# Patient Record
Sex: Female | Born: 1970
Health system: Southern US, Community
[De-identification: ages and names within clinical notes are randomized; demographics above are authoritative.]

## PROBLEM LIST (undated history)

## (undated) DIAGNOSIS — I10 Essential (primary) hypertension: Secondary | ICD-10-CM

## (undated) HISTORY — PX: CARPAL TUNNEL RELEASE: SHX101

## (undated) HISTORY — DX: Essential (primary) hypertension: I10

---

## 2015-09-20 ENCOUNTER — Ambulatory Visit: Payer: BLUE CROSS/BLUE SHIELD

## 2016-06-02 ENCOUNTER — Ambulatory Visit (INDEPENDENT_AMBULATORY_CARE_PROVIDER_SITE_OTHER): Payer: BLUE CROSS/BLUE SHIELD | Admitting: Emergency Medicine

## 2016-06-02 ENCOUNTER — Encounter: Payer: Self-pay | Admitting: Emergency Medicine

## 2016-06-02 VITALS — BP 193/102 | HR 77 | Temp 99.0°F | Resp 16 | Ht 60.5 in | Wt 107.6 lb

## 2016-06-02 DIAGNOSIS — Z0001 Encounter for general adult medical examination with abnormal findings: Secondary | ICD-10-CM

## 2016-06-02 DIAGNOSIS — R011 Cardiac murmur, unspecified: Secondary | ICD-10-CM | POA: Diagnosis not present

## 2016-06-02 DIAGNOSIS — R030 Elevated blood-pressure reading, without diagnosis of hypertension: Secondary | ICD-10-CM | POA: Diagnosis not present

## 2016-06-02 DIAGNOSIS — I10 Essential (primary) hypertension: Secondary | ICD-10-CM | POA: Insufficient documentation

## 2016-06-02 MED ORDER — LISINOPRIL-HYDROCHLOROTHIAZIDE 10-12.5 MG PO TABS: 1.0000 | ORAL_TABLET | Freq: Every day | ORAL | 3 refills | Status: DC

## 2016-06-02 NOTE — Patient Instructions (Addendum)
IF you received an x-ray today, you will receive an invoice from [facility]. Please contact [facility] at [phone] with questions or concerns regarding your invoice.   IF you received labwork today, you will receive an invoice from [place]. Please contact [facility] at [phone] with questions or concerns regarding your invoice.   Our billing staff will not be able to assist you with questions regarding bills from these companies.  You will be contacted with the lab results as soon as they are available. The fastest way to get your results is to activate your My Chart account. Instructions are located on the last page of this paperwork. If you have not heard from [place] regarding the results in 2 weeks, please contact this office.      Health Maintenance, Female Adopting a healthy lifestyle and getting preventive care can go a long way to promote health and wellness. Talk with your health care provider about what schedule of regular examinations is right for you. This is a good chance for you to check in with your provider about disease prevention and staying healthy. In between checkups, there are plenty of things you can do on your own. Experts have done a lot of research about which lifestyle changes and preventive measures are most likely to keep you healthy. Ask your health care provider for more information. Weight and diet Eat a healthy diet  Be sure to include plenty of vegetables, fruits, low-fat dairy products, and lean protein.  Do not eat a lot of foods high in solid fats, added sugars, or salt.  Get regular exercise. This is one of the most important things you can do for your health.  Most adults should exercise for at least 150 minutes each week. The exercise should increase your heart rate and make you sweat (moderate-intensity exercise).  Most adults should also do strengthening exercises at least twice a week. This is in addition to the  moderate-intensity exercise. Maintain a healthy weight  Body mass index (BMI) is a measurement that can be used to identify possible weight problems. It estimates body fat based on height and weight. Your health care provider can help determine your BMI and help you achieve or maintain a healthy weight.  For females 64 years of age and older:  A BMI below 18.5 is considered underweight.  A BMI of 18.5 to 24.9 is normal.  A BMI of 25 to 29.9 is considered overweight.  A BMI of 30 and above is considered obese. Watch levels of cholesterol and blood lipids  You should start having your blood tested for lipids and cholesterol at 47 years of age, then have this test every 5 years.  You may need to have your cholesterol levels checked more often if:  Your lipid or cholesterol levels are high.  You are older than 47 years of age.  You are at high risk for heart disease. Cancer screening Lung Cancer  Lung cancer screening is recommended for adults 56-24 years old who are at high risk for lung cancer because of a history of smoking.  A yearly low-dose CT scan of the lungs is recommended for people who:  Currently smoke.  Have quit within the past 15 years.  Have at least a 30-pack-year history of smoking. A pack year is smoking an average of one pack of cigarettes a day for 1 year.  Yearly screening should continue until it has been 15 years since you quit.  Yearly screening should  stop if you develop a health problem that would prevent you from having lung cancer treatment. Breast Cancer  Practice breast self-awareness. This means understanding how your breasts normally appear and feel.  It also means doing regular breast self-exams. Let your health care provider know about any changes, no matter how small.  If you are in your 20s or 30s, you should have a clinical breast exam (CBE) by a health care provider every 1-3 years as part of a regular health exam.  If you are 40 or  older, have a CBE every year. Also consider having a breast X-ray (mammogram) every year.  If you have a family history of breast cancer, talk to your health care provider about genetic screening.  If you are at high risk for breast cancer, talk to your health care provider about having an MRI and a mammogram every year.  Breast cancer gene (BRCA) assessment is recommended for women who have family members with BRCA-related cancers. BRCA-related cancers include:  Breast.  Ovarian.  Tubal.  Peritoneal cancers.  Results of the assessment will determine the need for genetic counseling and BRCA1 and BRCA2 testing. Cervical Cancer  Your health care provider may recommend that you be screened regularly for cancer of the pelvic organs (ovaries, uterus, and vagina). This screening involves a pelvic examination, including checking for microscopic changes to the surface of your cervix (Pap test). You may be encouraged to have this screening done every 3 years, beginning at age 21.  For women ages 30-65, health care providers may recommend pelvic exams and Pap testing every 3 years, or they may recommend the Pap and pelvic exam, combined with testing for human papilloma virus (HPV), every 5 years. Some types of HPV increase your risk of cervical cancer. Testing for HPV may also be done on women of any age with unclear Pap test results.  Other health care providers may not recommend any screening for nonpregnant women who are considered low risk for pelvic cancer and who do not have symptoms. Ask your health care provider if a screening pelvic exam is right for you.  If you have had past treatment for cervical cancer or a condition that could lead to cancer, you need Pap tests and screening for cancer for at least 20 years after your treatment. If Pap tests have been discontinued, your risk factors (such as having a new sexual partner) need to be reassessed to determine if screening should resume. Some  women have medical problems that increase the chance of getting cervical cancer. In these cases, your health care provider may recommend more frequent screening and Pap tests. Colorectal Cancer  This type of cancer can be detected and often prevented.  Routine colorectal cancer screening usually begins at 47 years of age and continues through 47 years of age.  Your health care provider may recommend screening at an earlier age if you have risk factors for colon cancer.  Your health care provider may also recommend using home test kits to check for hidden blood in the stool.  A small camera at the end of a tube can be used to examine your colon directly (sigmoidoscopy or colonoscopy). This is done to check for the earliest forms of colorectal cancer.  Routine screening usually begins at age 50.  Direct examination of the colon should be repeated every 5-10 years through 47 years of age. However, you may need to be screened more often if early forms of precancerous polyps or small growths are   found. Skin Cancer  Check your skin from head to toe regularly.  Tell your health care provider about any new moles or changes in moles, especially if there is a change in a mole's shape or color.  Also tell your health care provider if you have a mole that is larger than the size of a pencil eraser.  Always use sunscreen. Apply sunscreen liberally and repeatedly throughout the day.  Protect yourself by wearing long sleeves, pants, a wide-brimmed hat, and sunglasses whenever you are outside. Heart disease, diabetes, and high blood pressure  High blood pressure causes heart disease and increases the risk of stroke. High blood pressure is more likely to develop in:  People who have blood pressure in the high end of the normal range (130-139/85-89 mm Hg).  People who are overweight or obese.  People who are African American.  If you are 66-36 years of age, have your blood pressure checked every  3-5 years. If you are 42 years of age or older, have your blood pressure checked every year. You should have your blood pressure measured twice-once when you are at a hospital or clinic, and once when you are not at a hospital or clinic. Record the average of the two measurements. To check your blood pressure when you are not at a hospital or clinic, you can use:  An automated blood pressure machine at a pharmacy.  A home blood pressure monitor.  If you are between 93 years and 4 years old, ask your health care provider if you should take aspirin to prevent strokes.  Have regular diabetes screenings. This involves taking a blood sample to check your fasting blood sugar level.  If you are at a normal weight and have a low risk for diabetes, have this test once every three years after 47 years of age.  If you are overweight and have a high risk for diabetes, consider being tested at a younger age or more often. Preventing infection Hepatitis B  If you have a higher risk for hepatitis B, you should be screened for this virus. You are considered at high risk for hepatitis B if:  You were born in a country where hepatitis B is common. Ask your health care provider which countries are considered high risk.  Your parents were born in a high-risk country, and you have not been immunized against hepatitis B (hepatitis B vaccine).  You have HIV or AIDS.  You use needles to inject street drugs.  You live with someone who has hepatitis B.  You have had sex with someone who has hepatitis B.  You get hemodialysis treatment.  You take certain medicines for conditions, including cancer, organ transplantation, and autoimmune conditions. Hepatitis C  Blood testing is recommended for:  Everyone born from [birth year] through 1966.  Anyone with known risk factors for hepatitis C. Sexually transmitted infections (STIs)  You should be screened for sexually transmitted infections (STIs) including  gonorrhea and chlamydia if:  You are sexually active and are younger than 47 years of age.  You are older than 47 years of age and your health care provider tells you that you are at risk for this type of infection.  Your sexual activity has changed since you were last screened and you are at an increased risk for chlamydia or gonorrhea. Ask your health care provider if you are at risk.  If you do not have HIV, but are at risk, it may be recommended that you take a  prescription medicine daily to prevent HIV infection. This is called pre-exposure prophylaxis (PrEP). You are considered at risk if:  You are sexually active and do not regularly use condoms or know the HIV status of your partner(s).  You take drugs by injection.  You are sexually active with a partner who has HIV. Talk with your health care provider about whether you are at high risk of being infected with HIV. If you choose to begin PrEP, you should first be tested for HIV. You should then be tested every 3 months for as long as you are taking PrEP. Pregnancy  If you are premenopausal and you may become pregnant, ask your health care provider about preconception counseling.  If you may become pregnant, take 400 to 800 micrograms (mcg) of folic acid every day.  If you want to prevent pregnancy, talk to your health care provider about birth control (contraception). Osteoporosis and menopause  Osteoporosis is a disease in which the bones lose minerals and strength with aging. This can result in serious bone fractures. Your risk for osteoporosis can be identified using a bone density scan.  If you are 65 years of age or older, or if you are at risk for osteoporosis and fractures, ask your health care provider if you should be screened.  Ask your health care provider whether you should take a calcium or vitamin D supplement to lower your risk for osteoporosis.  Menopause may have certain physical symptoms and risks.  Hormone  replacement therapy may reduce some of these symptoms and risks. Talk to your health care provider about whether hormone replacement therapy is right for you. Follow these instructions at home:  Schedule regular health, dental, and eye exams.  Stay current with your immunizations.  Do not use any tobacco products including cigarettes, chewing tobacco, or electronic cigarettes.  If you are pregnant, do not drink alcohol.  If you are breastfeeding, limit how much and how often you drink alcohol.  Limit alcohol intake to no more than 1 drink per day for nonpregnant women. One drink equals 12 ounces of beer, 5 ounces of wine, or 1 ounces of hard liquor.  Do not use street drugs.  Do not share needles.  Ask your health care provider for help if you need support or information about quitting drugs.  Tell your health care provider if you often feel depressed.  Tell your health care provider if you have ever been abused or do not feel safe at home. This information is not intended to replace advice given to you by your health care provider. Make sure you discuss any questions you have with your health care provider. Document Released: 09/09/2010 Document Revised: 08/02/2015 Document Reviewed: 11/28/2014 Elsevier Interactive Patient Education  2017 [employer]vier Inc.  American Heart Association (AHA) Exercise Recommendation  Being physically active is important to prevent heart disease and stroke, the nation's No. 1and No. 5killers. To improve overall cardiovascular health, we suggest at least 150 minutes per week of moderate exercise or 75 minutes per week of vigorous exercise (or a combination of moderate and vigorous activity). Thirty minutes a day, five times a week is an easy goal to remember. You will also experience benefits even if you divide your time into two or three segments of 10 to 15 minutes per day.  For people who would benefit from lowering their blood pressure or cholesterol,  we recommend 40 minutes of aerobic exercise of moderate to vigorous intensity three to four times a week to   lower the risk for heart attack and stroke.  Physical activity is anything that makes you move your body and burn calories.  This includes things like climbing stairs or playing sports. Aerobic exercises benefit your heart, and include walking, jogging, swimming or biking. Strength and stretching exercises are best for overall stamina and flexibility.  The simplest, positive change you can make to effectively improve your heart health is to start walking. It's enjoyable, free, easy, social and great exercise. A walking program is flexible and boasts high success rates because people can stick with it. It's easy for walking to become a regular and satisfying part of life.   For Overall Cardiovascular Health:  At least 30 minutes of moderate-intensity aerobic activity at least 5 days per week for a total of 150  OR   At least 25 minutes of vigorous aerobic activity at least 3 days per week for a total of 75 minutes; or a combination of moderate- and vigorous-intensity aerobic activity  AND   Moderate- to high-intensity muscle-strengthening activity at least 2 days per week for additional health benefits.  For Lowering Blood Pressure and Cholesterol  An average 40 minutes of moderate- to vigorous-intensity aerobic activity 3 or 4 times per week  What if I can't make it to the time goal? Something is always better than nothing! And everyone has to start somewhere. Even if you've been sedentary for years, today is the day you can begin to make healthy changes in your life. If you don't think you'll make it for 30 or 40 minutes, set a reachable goal for today. You can work up toward your overall goal by increasing your time as you get stronger. Don't let all-or-nothing thinking rob you of doing what you can every day.  Source:http://www.heart.org    T?ng huy?t p Hypertension T?ng  huy?t p, th??ng ???c g?i l huy?t p cao, l khi l?c b?m mu qua ??ng m?ch c?a qu v? qu m?nh. ??ng m?ch c?a qu v? l cc m?ch mu mang mu t? tim ?i kh?p c? th?. T?ng huy?t p khi?n tim lm vi?c v?t v? h?n ?? b?m mu v c th? khi?n cc ??ng m?ch tr? ln h?p ho?c c?ng. T?ng huy?t p khng ???c ?i?u tr? ho?c khng ki?m sot ???c c th? d?n t?i nh?i mu c? tim, ??t qu?, b?nh th?n v nh?ng v?n ?? khc. Ch? s? ?o huy?t p g?m m?t ch? s? cao trn m?t ch? s? th?p. Huy?t a?p ly? t???ng cu?a quy? vi? la? d??i 120/80. Ch? s? ??u tin ("??nh") ???c g?i l huy?t p tm thu. ?y l s? ?o p su?t trong ??ng m?ch khi tim qu v? ??p. Ch? s? th? hai ("?y") ???c g?i l huy?t p tm tr??ng. ?y l s? ?o p su?t trong ??ng m?ch khi tim qu v? ngh?Lourdes Sledge nhn g gy ra? Khng r nguyn nhn gy ra tnh tr?ng ny. ?i?u g lm t?ng nguy c?? M?t s? y?u t? nguy c? d?n ??n huy?t p cao c th? ki?m sot ???c. M?t s? y?u t? khc th khng. Nh?ng y?u t? qu v? c th? thay ??i   Ht thu?c.  B? b?nh ti?u ???ng tup 2, cholesterol cao, ho?c c? hai.  Khng t?p th? d?c ho?c cc ho?t ??ng th? ch?t ??y ??Marland Kitchen  Th?a cn.  ?n qu nhi?u ch?t bo, ???ng, ca-lo, ho?c mu?i (Natri).  U?ng qu nhi?u r??u. Nh?ng y?u t? kh ho?c khng th? thay ??i   B?nh th?n m?n tnh.  C ti?n s? gia ?nh b? cao huy?t p.  ?? tu?i. Nguy c? t?ng ln theo ?? tu?i.  Ch?ng t?c. Qu v? c th? c nguy c? cao h?n n?u qu v? l ng??i M? g?c Phi.  Gi?i tnh. Nam gi?i c nguy c? cao h?n ph? n? tr??c tu?i 44. Sau tu?i 64, ph? n? c nguy c? cao h?n nam gi?i.  Ng?ng th? do t?c ngh?n khi ng?.  C?ng th?ng. Cc d?u hi?u ho?c tri?u ch?ng l g? Huy?t p qu cao (c?n t?ng huy?t p) c th? gy ra:  ?au ??u.  Lo u.  Kh th?.  Ch?y mu cam.  Bu?n nn v nn.  ?au ng?c n?ng.  C? ??ng gi?t gi?t qu v? khng th? ki?m sot ???c (co gi?t). Ch?n ?on tnh tr?ng ny nh? th? no? Tnh tr?ng ny ???c ch?n ?on b?ng cch ?o huy?t p c?a qu v? lc  qu v? ng?i, ?? tay trn m?t m?t ph?ng. B?ng qu?n thi?t b? ?o huy?t p s? ???c qu?n tr?c ti?pvo vng da cnh tay pha trn c?a qu v? ngang v?i m?c tim. Huy?t p c?n ???c ?o t nh?t hai l?n trn cng m?t cnh tay. M?t s? tnh tr?ng nh?t ??nh c th? lm cho huy?t p khc nhau gi?a tay ph?i v tay tri c?a qu v?. M?t s? y?u t? nh?t ??nh c th? khi?n ch? s? ?o huy?t p th?p h?n ho?c cao h?n so v?i bnh th??ng (t?ng) trong th?i gian ng?n:  Khi huy?t p c?a qu v? ? phng khm c?a chuyn gia ch?m [place] s?c kh?e cao h?n so v?i lc qu v? ? nh, hi?n t??ng ny ???c g?i l t?ng huy?t p o chong tr?ng. H?u h?t nh?ng ng??i b? tnh tr?ng ny ??u khng c?n dng thu?c.  Khi huy?t p c?a qu v? lc ? nh cao h?n so v?i lc qu v? ? phng khm chuyn gia ch?m [place] s?c kh?e, hi?n t??ng ny ???c g?i l t?ng huy?t p m?t n?. H?u h?t nh?ng ng??i b? tnh tr?ng ny ??u c th? c?n dng thu?c ?? ki?m sot huy?t p. N?u qu v? c ch? s? huy?t p cao trong m?t l?n khm ho?c qu v? c huy?t p bnh th??ng c km cc y?u t? nguy c? khc:  Qu v? c th? ???c yu c?u tr? l?i vo m?t ngy khc ?? ki?m tra l?i huy?t p.  Qu v? c th? ???c yu c?u theo di huy?t p t?i nh trong vng 1 tu?n ho?c lu h?n. N?u qu v? ???c ch?n ?on b? t?ng huy?t p, qu v? c th? c?n th?c hi?n cc xt nghi?m mu ho?c ki?m tra hnh ?nh khc ?? gip chuyn gia ch?m [place] s?c kh?e hi?u nguy c? t?ng th? m?c cc b?nh tr?ng khc. Tnh tr?ng ny ???c ?i?u tr? nh? th? no? Tnh tr?ng ny ???c ?i?u tr? b?ng cch thay ??i l?i s?ng lnh m?nh, ch?ng h?nh nh? ?n th?c ph?m c l?i cho s?c kh?e, t?p th? d?c nhi?u h?n v gi?m l??ng r??u u?ng vo. N?u thay ??i l?i s?ng khng ?? ?? ??a huy?t p v? m?c c th? ki?m sot ???c, chuyn gia ch?m [place] s?c kh?e c th? k ??n thu?c, v n?u:  Huy?t p tm thu c?a qu v? trn 130.  Huy?t p tm tr??ng c?a qu v? trn 80. Huy?t p m?c tiu c nhn c?a qu v? c th? khc nhau ty thu?c v tnh tr?ng b?nh l, tu?i v cc nhn t?  khc. Tun th? nh?ng h??ng d?n ny ? nh: ?n v u?ng   ?n ch? ?? giu ch?t x? v kali v t natri, ???ng ph? gia v ch?t bo. M?t k? ho?ch ?n m?u c tn ch? ?? ?n DASH (Cch ti?p c?n ?n u?ng ?? gi?m t?ng huy?t p). ?n theo cch ny:  ?n nhi?u tri cy v rau t??i. Vo m?i b?a ?n, c? g?ng dnh m?t n?a ??a cho tri cy v rau.  ?n ng? c?c nguyn h?t, ch?ng h?n nh? m ?ng lm t? b?t m nguyn cm, ho?c bnh m nguyn h?t. Cho ngu? c?c nguyn ca?m va?o m?t ph?n t? ??a c?a quy? vi?.  ?n ho?c hu?ng cc s?n ph?m t? s?a t bo, ch?ng h?n nh? s?a ? b? kem ho?c s?a chua t bo.  Trnh nh?ng mi?ng th?t nhi?u m?, th?t ? qua ch? bi?n ho?c th?t ??p mu?i v th?t gia c?m c da. Dnh kho?ng m?t ph?n t? ??a c?a qu v? cho cc protein khng m?, ch?ng h?n nh? c, th?t g khng da, ??u, tr?ng, v ??u ph?.  Trnh nh?ng th?c ph?m ch? bi?n ho?c lm s?n. Nh?ng th?c ph?m ny th??ng c nhi?u natri, ???ng ph? gia v ch?t bo h?n.  Gi?m l??ng dng natri hng ngy c?a qu v?. H?u h?t nh?ng ng??i b? t?ng huy?t p ??u nn ?n d??i 1.500 mg natri m?i ngy.  Gi?i h?n l??ng r??u qu v? u?ng khng qu 1 ly m?i ngy v?i ph? n? khng mang thai v 2 ly m?i ngy v?i nam gi?i. M?t ly t??ng ???ng v?i 12 ao-x? bia, 5 ao-x? r??u vang, ho?c 1 ao-x? r??u m?nh. L?i s?ng   H?p tc v?i chuyn gia ch?m [place] s?c kh?e c?a qu v? ?? duy tr tr?ng l??ng c? th? c l?i cho s?c kh?e ho?c gi?m cn. Hy h?i xem tr?ng l??ng no l l t??ng cho qu v?.  Dnh t nh?t 30 pht ?? t?p th? d?c m c th? khi?n tim qu v? ??p nhanh h?n (t?p th? d?c nh?p ?i?u) h?u h?t cc ngy trong tu?n. Cc ho?t ??ng c th? bao g?m ?i b?, b?i, ho?c ??p xe.  Bao g?m bi t?p t?ng c??ng c? (bi t?p khng l?c), ch?ng h?n nh? bi t?p [place] ho?c nng t?, nh? m?t ph?n c?a thi quen luy?n t?p hng tu?n c?a qu v?. C? g?ng t?p nh?ng lo?i bi t?p ny trong vng 30 pht t?i thi?u 3 ngy m?t tu?n.  Khng s? d?ng b?t k? s?n ph?m no ch?a nicotine ho?c thu?c l, ch?ng ha?n nh?  thu?c l d?ng ht v thu?c l ?i?n t?. N?u qu v? c?n gip ?? ?? cai thu?c, hy h?i chuyn gia ch?m [place] s?c kh?e.  Theo di huy?t p c?a qu v? t?i nh theo h??ng d?n c?a chuyn gia ch?m [place] s?c kh?e.  Tun th? t?t c? cc cu?c h?n khm l?i theo ch? d?n c?a chuyn gia ch?m [place] s?c kh?e. ?i?u ny c vai tr quan tr?ng. Thu?c   Ch? s? d?ng thu?c khng k ??n v thu?c k ??n theo ch? d?n c?a chuyn gia ch?m [place] s?c kh?e. Lm theo ch? d?n m?t cch c?n th?n. Thu?c ?i?u tr? huy?t p ph?i ???c dng theo ??n ? k.  Khng b? li?u thu?c huy?t p. B? li?u khi?n qu v? c nguy c? g?p ph?i cc v?n ?? v c th? lm cho thu?c gi?m hi?u qu?Marland Kitchen  Hy h?i chuyn gia ch?m [place] s?c kh?e c?a qu v? v? nh?ng  tc d?ng ph? ho?c ph?n ?ng v?i thu?c m qu v? ph?i theo di. Hy lin l?c v?i chuyn gia ch?m [place] s?c kh?e n?u:  Qu v? ngh? qu v? c ph?n ?ng v?i thu?c ?ang dng.  Qu v? b? ?au ??u ti?p t?c tr? l?i (ti pht).  Qu v? c?m th?y chng m?t.  Qu v? b? s?ng ph ? m?t c chn.  Qu v? c v?n ?? v? th? l?c. Yu c?u tr? gip ngay l?p t?c n?u:  Qu v? b? ?au ??u n?ng ho?c l l?n.  Qu v? b? y?u b?t th??ng ho?c t b.  Quy? vi? ca?m th?y bi? ng?t.  Qu v? b? ?au r?t nhi?u ? ng?c ho?c b?ng.  Qu v? nn nhi?u l?n.  Qu v? b? kh th?. Tm t?t  T?ng huy?t p l khi l?c b?m mu qua ??ng m?ch c?a qu v? qu m?nh. N?u tnh tr?ng ny khng ???c ki?m sot, n c th? khi?n qu v? g?p ph?i nguy c? bi?n ch?ng nghim tr?ng.  Huy?t p m?c tiu c nhn c?a qu v? c th? khc nhau ty thu?c v tnh tr?ng b?nh l, tu?i v cc nhn t? khc. ??i v?i h?u h?t m?i ng??i, huy?t p bnh th??ng l d??i 120/80.  ?i?u tr? t?ng huy?t p b?ng cch thay ??i l?i s?ng, dng thu?c, ho?c k?t h?p c? hai. Thay ??i l?i s?ng bao g?m gi?m cn, ?n ch? ?? ?n c l?i cho s?c kh?e, t mu?i, t?p th? d?c nhi?u h?n v h?n ch? u?ng r??u. Thng tin ny khng nh?m m?c ?ch thay th? cho l?i khuyn m chuyn gia ch?m [place] s?c kh?e ni v?i qu v?. Hy  b?o ??m qu v? ph?i th?o lu?n b?t k? v?n ?? g m qu v? c v?i chuyn gia ch?m  s?c kh?e c?a qu v?. Document Released: 02/24/2005 Document Revised: 02/06/2016 Document Reviewed: 02/06/2016 Elsevier Interactive Patient Education  2017 [employer].

## 2016-06-02 NOTE — Progress Notes (Signed)
Jing Schmoll 46 y.o.   Chief Complaint  Patient presents with  . chest feels tired    x 1 week, whole body feels tired, not getting enough sleep    HISTORY OF PRESENT ILLNESS: This is a 46 y.o. female has no complaints except feeling tired at times; here for general examination; hasn't been to a doctor in years; sister translating for her.  HPI   Prior to Admission medications   Medication Sig Start Date End Date Taking? Authorizing Provider  acetaminophen (TYLENOL) 500 MG tablet Take 500 mg by mouth every 6 (six) hours as needed.   Yes Historical Provider, MD  glucosamine-chondroitin 500-400 MG tablet Take 1 tablet by mouth 3 (three) times daily.   Yes Historical Provider, MD    No Known Allergies  There are no active problems to display for this patient.   No past medical history on file.  No past surgical history on file.  Social History   Social History  . Marital status: Married    Spouse name: N/A  . Number of children: N/A  . Years of education: N/A   Occupational History  . Not on file.   Social History Main Topics  . Smoking status: Never Smoker  . Smokeless tobacco: Never Used  . Alcohol use No  . Drug use: No  . Sexual activity: Not on file   Other Topics Concern  . Not on file   Social History Narrative  . No narrative on file    No family history on file.   Review of Systems  Constitutional: Negative for chills, fever and weight loss.  HENT: Negative.  Negative for ear pain, hearing loss, nosebleeds and sore throat.   Eyes: Negative.  Negative for blurred vision, double vision, discharge and redness.  Respiratory: Negative.  Negative for cough, shortness of breath and wheezing.   Cardiovascular: Negative.  Negative for chest pain, palpitations and leg swelling.  Gastrointestinal: Negative for abdominal pain, diarrhea, nausea and vomiting.  Genitourinary: Negative for dysuria and hematuria.  Skin: Negative.  Negative for rash.    Neurological: Positive for weakness (general). Negative for dizziness, sensory change, focal weakness and headaches.  Endo/Heme/Allergies: Negative.   Psychiatric/Behavioral: The patient has insomnia.   All other systems reviewed and are negative.  Vitals:   06/02/16 0914 06/02/16 1022  BP: (!) 181/98 (!) 193/102  Pulse: 77   Resp: 16   Temp: 99 F (37.2 C)     Physical Exam  Constitutional: She is oriented to person, place, and time. She appears well-developed and well-nourished.  HENT:  Head: Normocephalic and atraumatic.  Right Ear: Tympanic membrane and external ear normal.  Left Ear: Tympanic membrane and external ear normal.  Nose: Nose normal.  Mouth/Throat: Oropharynx is clear and moist.  Eyes: Conjunctivae and EOM are normal. Pupils are equal, round, and reactive to light.  Neck: Normal range of motion. Neck supple. No JVD present. No thyromegaly present.  Cardiovascular: Normal rate, regular rhythm and intact distal pulses.   Murmur heard.  Systolic murmur is present with a grade of 3/6  Pulmonary/Chest: Effort normal and breath sounds normal.  Abdominal: Soft. Bowel sounds are normal. She exhibits no distension and no mass. There is no tenderness.  Musculoskeletal: Normal range of motion.  Lymphadenopathy:    She has no cervical adenopathy.  Neurological: She is alert and oriented to person, place, and time. She displays normal reflexes. No sensory deficit. She exhibits normal muscle tone. Coordination normal.  Skin: Skin  is warm and dry. Capillary refill takes less than 2 seconds.  Psychiatric: She has a normal mood and affect. Her behavior is normal.  Vitals reviewed.  EKG: NSR; no acute ischemic changes.  ASSESSMENT & PLAN: Jamari was seen today for chest feels tired.  Diagnoses and all orders for this visit:  Encounter for general adult medical examination with abnormal findings -     Hepatitis C antibody screen -     HIV antibody -     Ambulatory  referral to Cardiology -     Comprehensive metabolic panel -     CBC with Differential/Platelet -     Lipid panel -     TSH -     Hemoglobin A1c -     EKG 12-Lead -     Ambulatory referral to Gynecology  Blood pressure elevated without history of HTN  Heart murmur  Other orders -     lisinopril-hydrochlorothiazide (PRINZIDE,ZESTORETIC) 10-12.5 MG tablet; Take 1 tablet by mouth daily.    Patient Instructions       IF you received an x-ray today, you will receive an invoice from [facility]. Please contact [facility] at [phone] with questions or concerns regarding your invoice.   IF you received labwork today, you will receive an invoice from [place]. Please contact [facility] at [phone] with questions or concerns regarding your invoice.   Our billing staff will not be able to assist you with questions regarding bills from these companies.  You will be contacted with the lab results as soon as they are available. The fastest way to get your results is to activate your My Chart account. Instructions are located on the last page of this paperwork. If you have not heard from [place] regarding the results in 2 weeks, please contact this office.      Health Maintenance, Female Adopting a healthy lifestyle and getting preventive care can go a long way to promote health and wellness. Talk with your health care provider about what schedule of regular examinations is right for you. This is a good chance for you to check in with your provider about disease prevention and staying healthy. In between checkups, there are plenty of things you can do on your own. Experts have done a lot of research about which lifestyle changes and preventive measures are most likely to keep you healthy. Ask your health care provider for more information. Weight and diet Eat a healthy diet  Be sure to include plenty of vegetables, fruits, low-fat dairy products, and lean protein.  Do  not eat a lot of foods high in solid fats, added sugars, or salt.  Get regular exercise. This is one of the most important things you can do for your health.  Most adults should exercise for at least 150 minutes each week. The exercise should increase your heart rate and make you sweat (moderate-intensity exercise).  Most adults should also do strengthening exercises at least twice a week. This is in addition to the moderate-intensity exercise. Maintain a healthy weight  Body mass index (BMI) is a measurement that can be used to identify possible weight problems. It estimates body fat based on height and weight. Your health care provider can help determine your BMI and help you achieve or maintain a healthy weight.  For females 51 years of age and older:  A BMI below 18.5 is considered underweight.  A BMI of 18.5 to 24.9 is normal.  A BMI of 25 to 29.9 is  considered overweight.  A BMI of 30 and above is considered obese. Watch levels of cholesterol and blood lipids  You should start having your blood tested for lipids and cholesterol at 47 years of age, then have this test every 5 years.  You may need to have your cholesterol levels checked more often if:  Your lipid or cholesterol levels are high.  You are older than 47 years of age.  You are at high risk for heart disease. Cancer screening Lung Cancer  Lung cancer screening is recommended for adults 85-66 years old who are at high risk for lung cancer because of a history of smoking.  A yearly low-dose CT scan of the lungs is recommended for people who:  Currently smoke.  Have quit within the past 15 years.  Have at least a 30-pack-year history of smoking. A pack year is smoking an average of one pack of cigarettes a day for 1 year.  Yearly screening should continue until it has been 15 years since you quit.  Yearly screening should stop if you develop a health problem that would prevent you from having lung cancer  treatment. Breast Cancer  Practice breast self-awareness. This means understanding how your breasts normally appear and feel.  It also means doing regular breast self-exams. Let your health care provider know about any changes, no matter how small.  If you are in your 20s or 30s, you should have a clinical breast exam (CBE) by a health care provider every 1-3 years as part of a regular health exam.  If you are 21 or older, have a CBE every year. Also consider having a breast X-ray (mammogram) every year.  If you have a family history of breast cancer, talk to your health care provider about genetic screening.  If you are at high risk for breast cancer, talk to your health care provider about having an MRI and a mammogram every year.  Breast cancer gene (BRCA) assessment is recommended for women who have family members with BRCA-related cancers. BRCA-related cancers include:  Breast.  Ovarian.  Tubal.  Peritoneal cancers.  Results of the assessment will determine the need for genetic counseling and BRCA1 and BRCA2 testing. Cervical Cancer  Your health care provider may recommend that you be screened regularly for cancer of the pelvic organs (ovaries, uterus, and vagina). This screening involves a pelvic examination, including checking for microscopic changes to the surface of your cervix (Pap test). You may be encouraged to have this screening done every 3 years, beginning at age 84.  For women ages 79-65, health care providers may recommend pelvic exams and Pap testing every 3 years, or they may recommend the Pap and pelvic exam, combined with testing for human papilloma virus (HPV), every 5 years. Some types of HPV increase your risk of cervical cancer. Testing for HPV may also be done on women of any age with unclear Pap test results.  Other health care providers may not recommend any screening for nonpregnant women who are considered low risk for pelvic cancer and who do not have  symptoms. Ask your health care provider if a screening pelvic exam is right for you.  If you have had past treatment for cervical cancer or a condition that could lead to cancer, you need Pap tests and screening for cancer for at least 20 years after your treatment. If Pap tests have been discontinued, your risk factors (such as having a new sexual partner) need to be reassessed to determine if  screening should resume. Some women have medical problems that increase the chance of getting cervical cancer. In these cases, your health care provider may recommend more frequent screening and Pap tests. Colorectal Cancer  This type of cancer can be detected and often prevented.  Routine colorectal cancer screening usually begins at 47 years of age and continues through 47 years of age.  Your health care provider may recommend screening at an earlier age if you have risk factors for colon cancer.  Your health care provider may also recommend using home test kits to check for hidden blood in the stool.  A small camera at the end of a tube can be used to examine your colon directly (sigmoidoscopy or colonoscopy). This is done to check for the earliest forms of colorectal cancer.  Routine screening usually begins at age 21.  Direct examination of the colon should be repeated every 5-10 years through 47 years of age. However, you may need to be screened more often if early forms of precancerous polyps or small growths are found. Skin Cancer  Check your skin from head to toe regularly.  Tell your health care provider about any new moles or changes in moles, especially if there is a change in a mole's shape or color.  Also tell your health care provider if you have a mole that is larger than the size of a pencil eraser.  Always use sunscreen. Apply sunscreen liberally and repeatedly throughout the day.  Protect yourself by wearing long sleeves, pants, a wide-brimmed hat, and sunglasses whenever you are  outside. Heart disease, diabetes, and high blood pressure  High blood pressure causes heart disease and increases the risk of stroke. High blood pressure is more likely to develop in:  People who have blood pressure in the high end of the normal range (130-139/85-89 mm Hg).  People who are overweight or obese.  People who are African American.  If you are 87-82 years of age, have your blood pressure checked every 3-5 years. If you are 72 years of age or older, have your blood pressure checked every year. You should have your blood pressure measured twice-once when you are at a hospital or clinic, and once when you are not at a hospital or clinic. Record the average of the two measurements. To check your blood pressure when you are not at a hospital or clinic, you can use:  An automated blood pressure machine at a pharmacy.  A home blood pressure monitor.  If you are between 66 years and 61 years old, ask your health care provider if you should take aspirin to prevent strokes.  Have regular diabetes screenings. This involves taking a blood sample to check your fasting blood sugar level.  If you are at a normal weight and have a low risk for diabetes, have this test once every three years after 47 years of age.  If you are overweight and have a high risk for diabetes, consider being tested at a younger age or more often. Preventing infection Hepatitis B  If you have a higher risk for hepatitis B, you should be screened for this virus. You are considered at high risk for hepatitis B if:  You were born in a country where hepatitis B is common. Ask your health care provider which countries are considered high risk.  Your parents were born in a high-risk country, and you have not been immunized against hepatitis B (hepatitis B vaccine).  You have HIV or AIDS.  You use needles to inject street drugs.  You live with someone who has hepatitis B.  You have had sex with someone who has  hepatitis B.  You get hemodialysis treatment.  You take certain medicines for conditions, including cancer, organ transplantation, and autoimmune conditions. Hepatitis C  Blood testing is recommended for:  Everyone born from [birth year] through 1966.  Anyone with known risk factors for hepatitis C. Sexually transmitted infections (STIs)  You should be screened for sexually transmitted infections (STIs) including gonorrhea and chlamydia if:  You are sexually active and are younger than 47 years of age.  You are older than 47 years of age and your health care provider tells you that you are at risk for this type of infection.  Your sexual activity has changed since you were last screened and you are at an increased risk for chlamydia or gonorrhea. Ask your health care provider if you are at risk.  If you do not have HIV, but are at risk, it may be recommended that you take a prescription medicine daily to prevent HIV infection. This is called pre-exposure prophylaxis (PrEP). You are considered at risk if:  You are sexually active and do not regularly use condoms or know the HIV status of your partner(s).  You take drugs by injection.  You are sexually active with a partner who has HIV. Talk with your health care provider about whether you are at high risk of being infected with HIV. If you choose to begin PrEP, you should first be tested for HIV. You should then be tested every 3 months for as long as you are taking PrEP. Pregnancy  If you are premenopausal and you may become pregnant, ask your health care provider about preconception counseling.  If you may become pregnant, take 400 to 800 micrograms (mcg) of folic acid every day.  If you want to prevent pregnancy, talk to your health care provider about birth control (contraception). Osteoporosis and menopause  Osteoporosis is a disease in which the bones lose minerals and strength with aging. This can result in serious bone  fractures. Your risk for osteoporosis can be identified using a bone density scan.  If you are 8 years of age or older, or if you are at risk for osteoporosis and fractures, ask your health care provider if you should be screened.  Ask your health care provider whether you should take a calcium or vitamin D supplement to lower your risk for osteoporosis.  Menopause may have certain physical symptoms and risks.  Hormone replacement therapy may reduce some of these symptoms and risks. Talk to your health care provider about whether hormone replacement therapy is right for you. Follow these instructions at home:  Schedule regular health, dental, and eye exams.  Stay current with your immunizations.  Do not use any tobacco products including cigarettes, chewing tobacco, or electronic cigarettes.  If you are pregnant, do not drink alcohol.  If you are breastfeeding, limit how much and how often you drink alcohol.  Limit alcohol intake to no more than 1 drink per day for nonpregnant women. One drink equals 12 ounces of beer, 5 ounces of wine, or 1 ounces of hard liquor.  Do not use street drugs.  Do not share needles.  Ask your health care provider for help if you need support or information about quitting drugs.  Tell your health care provider if you often feel depressed.  Tell your health care provider if you have ever been abused  or do not feel safe at home. This information is not intended to replace advice given to you by your health care provider. Make sure you discuss any questions you have with your health care provider. Document Released: 09/09/2010 Document Revised: 08/02/2015 Document Reviewed: 11/28/2014 Elsevier Interactive Patient Education  2017 [facility]) Exercise Recommendation  Being physically active is important to prevent heart disease and stroke, the nation's No. 1and No. 5killers. To improve overall cardiovascular health,  we suggest at least 150 minutes per week of moderate exercise or 75 minutes per week of vigorous exercise (or a combination of moderate and vigorous activity). Thirty minutes a day, five times a week is an easy goal to remember. You will also experience benefits even if you divide your time into two or three segments of 10 to 15 minutes per day.  For people who would benefit from lowering their blood pressure or cholesterol, we recommend 40 minutes of aerobic exercise of moderate to vigorous intensity three to four times a week to lower the risk for heart attack and stroke.  Physical activity is anything that makes you move your body and burn calories.  This includes things like climbing stairs or playing sports. Aerobic exercises benefit your heart, and include walking, jogging, swimming or biking. Strength and stretching exercises are best for overall stamina and flexibility.  The simplest, positive change you can make to effectively improve your heart health is to start walking. It's enjoyable, free, easy, social and great exercise. A walking program is flexible and boasts high success rates because people can stick with it. It's easy for walking to become a regular and satisfying part of life.   For Overall Cardiovascular Health:  At least 30 minutes of moderate-intensity aerobic activity at least 5 days per week for a total of 150  OR   At least 25 minutes of vigorous aerobic activity at least 3 days per week for a total of 75 minutes; or a combination of moderate- and vigorous-intensity aerobic activity  AND   Moderate- to high-intensity muscle-strengthening activity at least 2 days per week for additional health benefits.  For Lowering Blood Pressure and Cholesterol  An average 40 minutes of moderate- to vigorous-intensity aerobic activity 3 or 4 times per week  What if I can't make it to the time goal? Something is always better than nothing! And everyone has to start somewhere.  Even if you've been sedentary for years, today is the day you can begin to make healthy changes in your life. If you don't think you'll make it for 30 or 40 minutes, set a reachable goal for today. You can work up toward your overall goal by increasing your time as you get stronger. Don't let all-or-nothing thinking rob you of doing what you can every day.  Source:http://www.heart.org    T?ng huy?t p Hypertension T?ng huy?t p, th??ng ???c g?i l huy?t p cao, l khi l?c b?m mu qua ??ng m?ch c?a qu v? qu m?nh. ??ng m?ch c?a qu v? l cc m?ch mu mang mu t? tim ?i kh?p c? th?. T?ng huy?t p khi?n tim lm vi?c v?t v? h?n ?? b?m mu v c th? khi?n cc ??ng m?ch tr? ln h?p ho?c c?ng. T?ng huy?t p khng ???c ?i?u tr? ho?c khng ki?m sot ???c c th? d?n t?i nh?i mu c? tim, ??t qu?, b?nh th?n v nh?ng v?n ?? khc. Ch? s? ?o huy?t p g?m m?t ch? s? cao trn m?t ch?  s? th?p. Huy?t a?p ly? t???ng cu?a quy? vi? la? d??i 120/80. Ch? s? ??u tin ("??nh") ???c g?i l huy?t p tm thu. ?y l s? ?o p su?t trong ??ng m?ch khi tim qu v? ??p. Ch? s? th? hai ("?y") ???c g?i l huy?t p tm tr??ng. ?y l s? ?o p su?t trong ??ng m?ch khi tim qu v? ngh?Lourdes Sledge nhn g gy ra? Khng r nguyn nhn gy ra tnh tr?ng ny. ?i?u g lm t?ng nguy c?? M?t s? y?u t? nguy c? d?n ??n huy?t p cao c th? ki?m sot ???c. M?t s? y?u t? khc th khng. Nh?ng y?u t? qu v? c th? thay ??i   Ht thu?c.  B? b?nh ti?u ???ng tup 2, cholesterol cao, ho?c c? hai.  Khng t?p th? d?c ho?c cc ho?t ??ng th? ch?t ??y ??Marland Kitchen  Th?a cn.  ?n qu nhi?u ch?t bo, ???ng, ca-lo, ho?c mu?i (Natri).  U?ng qu nhi?u r??u. Nh?ng y?u t? kh ho?c khng th? thay ??i   B?nh th?n m?n tnh.  C ti?n s? gia ?nh b? cao huy?t p.  ?? tu?i. Nguy c? t?ng ln theo ?? tu?i.  Ch?ng t?c. Qu v? c th? c nguy c? cao h?n n?u qu v? l ng??i M? g?c Phi.  Gi?i tnh. Nam gi?i c nguy c? cao h?n ph? n? tr??c tu?i 44. Sau tu?i 64, ph? n? c  nguy c? cao h?n nam gi?i.  Ng?ng th? do t?c ngh?n khi ng?.  C?ng th?ng. Cc d?u hi?u ho?c tri?u ch?ng l g? Huy?t p qu cao (c?n t?ng huy?t p) c th? gy ra:  ?au ??u.  Lo u.  Kh th?.  Ch?y mu cam.  Bu?n nn v nn.  ?au ng?c n?ng.  C? ??ng gi?t gi?t qu v? khng th? ki?m sot ???c (co gi?t). Ch?n ?on tnh tr?ng ny nh? th? no? Tnh tr?ng ny ???c ch?n ?on b?ng cch ?o huy?t p c?a qu v? lc qu v? ng?i, ?? tay trn m?t m?t ph?ng. B?ng qu?n thi?t b? ?o huy?t p s? ???c qu?n tr?c ti?pvo vng da cnh tay pha trn c?a qu v? ngang v?i m?c tim. Huy?t p c?n ???c ?o t nh?t hai l?n trn cng m?t cnh tay. M?t s? tnh tr?ng nh?t ??nh c th? lm cho huy?t p khc nhau gi?a tay ph?i v tay tri c?a qu v?. M?t s? y?u t? nh?t ??nh c th? khi?n ch? s? ?o huy?t p th?p h?n ho?c cao h?n so v?i bnh th??ng (t?ng) trong th?i gian ng?n:  Khi huy?t p c?a qu v? ? phng khm c?a chuyn gia ch?m [employer] s?c kh?e cao h?n so v?i lc qu v? ? nh, hi?n t??ng ny ???c g?i l t?ng huy?t p o chong tr?ng. H?u h?t nh?ng ng??i b? tnh tr?ng ny ??u khng c?n dng thu?c.  Khi huy?t p c?a qu v? lc ? nh cao h?n so v?i lc qu v? ? phng khm chuyn gia ch?m [place] s?c kh?e, hi?n t??ng ny ???c g?i l t?ng huy?t p m?t n?. H?u h?t nh?ng ng??i b? tnh tr?ng ny ??u c th? c?n dng thu?c ?? ki?m sot huy?t p. N?u qu v? c ch? s? huy?t p cao trong m?t l?n khm ho?c qu v? c huy?t p bnh th??ng c km cc y?u t? nguy c? khc:  Qu v? c th? ???c yu c?u tr? l?i vo m?t ngy khc ?? ki?m tra l?i huy?t p.  Qu v? c th? ???c yu c?u  theo di huy?t p t?i nh trong vng 1 tu?n ho?c lu h?n. N?u qu v? ???c ch?n ?on b? t?ng huy?t p, qu v? c th? c?n th?c hi?n cc xt nghi?m mu ho?c ki?m tra hnh ?nh khc ?? gip chuyn gia ch?m  [place] s?c kh?e hi?u nguy c? t?ng th? m?c cc b?nh tr?ng khc. Tnh tr?ng ny ???c ?i?u tr? nh? th? no? Tnh tr?ng ny ???c ?i?u tr? b?ng cch thay ??i l?i s?ng lnh m?nh,  ch?ng h?nh nh? ?n th?c ph?m c l?i cho s?c kh?e, t?p th? d?c nhi?u h?n v gi?m l??ng r??u u?ng vo. N?u thay ??i l?i s?ng khng ?? ?? ??a huy?t p v? m?c c th? ki?m sot ???c, chuyn gia ch?m [place] s?c kh?e c th? k ??n thu?c, v n?u:  Huy?t p tm thu c?a qu v? trn 130.  Huy?t p tm tr??ng c?a qu v? trn 80. Huy?t p m?c tiu c nhn c?a qu v? c th? khc nhau ty thu?c v tnh tr?ng b?nh l, tu?i v cc nhn t? khc. Tun th? nh?ng h??ng d?n ny ? nh: ?n v u?ng   ?n ch? ?? giu ch?t x? v kali v t natri, ???ng ph? gia v ch?t bo. M?t k? ho?ch ?n m?u c tn ch? ?? ?n DASH (Cch ti?p c?n ?n u?ng ?? gi?m t?ng huy?t p). ?n theo cch ny:  ?n nhi?u tri cy v rau t??i. Vo m?i b?a ?n, c? g?ng dnh m?t n?a ??a cho tri cy v rau.  ?n ng? c?c nguyn h?t, ch?ng h?n nh? m ?ng lm t? b?t m nguyn cm, ho?c bnh m nguyn h?t. Cho ngu? c?c nguyn ca?m va?o m?t ph?n t? ??a c?a quy? vi?.  ?n ho?c hu?ng cc s?n ph?m t? s?a t bo, ch?ng h?n nh? s?a ? b? kem ho?c s?a chua t bo.  Trnh nh?ng mi?ng th?t nhi?u m?, th?t ? qua ch? bi?n ho?c th?t ??p mu?i v th?t gia c?m c da. Dnh kho?ng m?t ph?n t? ??a c?a qu v? cho cc protein khng m?, ch?ng h?n nh? c, th?t g khng da, ??u, tr?ng, v ??u ph?.  Trnh nh?ng th?c ph?m ch? bi?n ho?c lm s?n. Nh?ng th?c ph?m ny th??ng c nhi?u natri, ???ng ph? gia v ch?t bo h?n.  Gi?m l??ng dng natri hng ngy c?a qu v?. H?u h?t nh?ng ng??i b? t?ng huy?t p ??u nn ?n d??i 1.500 mg natri m?i ngy.  Gi?i h?n l??ng r??u qu v? u?ng khng qu 1 ly m?i ngy v?i ph? n? khng mang thai v 2 ly m?i ngy v?i nam gi?i. M?t ly t??ng ???ng v?i 12 ao-x? bia, 5 ao-x? r??u vang, ho?c 1 ao-x? r??u m?nh. L?i s?ng   H?p tc v?i chuyn gia ch?m [place] s?c kh?e c?a qu v? ?? duy tr tr?ng l??ng c? th? c l?i cho s?c kh?e ho?c gi?m cn. Hy h?i xem tr?ng l??ng no l l t??ng cho qu v?.  Dnh t nh?t 30 pht ?? t?p th? d?c m c th? khi?n tim qu v? ??p nhanh h?n  (t?p th? d?c nh?p ?i?u) h?u h?t cc ngy trong tu?n. Cc ho?t ??ng c th? bao g?m ?i b?, b?i, ho?c ??p xe.  Bao g?m bi t?p t?ng c??ng c? (bi t?p khng l?c), ch?ng h?n nh? bi t?p [place] ho?c nng t?, nh? m?t ph?n c?a thi quen luy?n t?p hng tu?n c?a qu v?. C? g?ng t?p nh?ng lo?i bi t?p ny trong vng 30 pht t?i thi?u 3  ngy m?t tu?n.  Khng s? d?ng b?t k? s?n ph?m no ch?a nicotine ho?c thu?c l, ch?ng ha?n nh? thu?c l d?ng ht v thu?c l ?i?n t?. N?u qu v? c?n gip ?? ?? cai thu?c, hy h?i chuyn gia ch?m [place] s?c kh?e.  Theo di huy?t p c?a qu v? t?i nh theo h??ng d?n c?a chuyn gia ch?m [place] s?c kh?e.  Tun th? t?t c? cc cu?c h?n khm l?i theo ch? d?n c?a chuyn gia ch?m [place] s?c kh?e. ?i?u ny c vai tr quan tr?ng. Thu?c   Ch? s? d?ng thu?c khng k ??n v thu?c k ??n theo ch? d?n c?a chuyn gia ch?m [place] s?c kh?e. Lm theo ch? d?n m?t cch c?n th?n. Thu?c ?i?u tr? huy?t p ph?i ???c dng theo ??n ? k.  Khng b? li?u thu?c huy?t p. B? li?u khi?n qu v? c nguy c? g?p ph?i cc v?n ?? v c th? lm cho thu?c gi?m hi?u qu?Marland Kitchen  Hy h?i chuyn gia ch?m [place] s?c kh?e c?a qu v? v? nh?ng tc d?ng ph? ho?c ph?n ?ng v?i thu?c m qu v? ph?i theo di. Hy lin l?c v?i chuyn gia ch?m [place] s?c kh?e n?u:  Qu v? ngh? qu v? c ph?n ?ng v?i thu?c ?ang dng.  Qu v? b? ?au ??u ti?p t?c tr? l?i (ti pht).  Qu v? c?m th?y chng m?t.  Qu v? b? s?ng ph ? m?t c chn.  Qu v? c v?n ?? v? th? l?c. Yu c?u tr? gip ngay l?p t?c n?u:  Qu v? b? ?au ??u n?ng ho?c l l?n.  Qu v? b? y?u b?t th??ng ho?c t b.  Quy? vi? ca?m th?y bi? ng?t.  Qu v? b? ?au r?t nhi?u ? ng?c ho?c b?ng.  Qu v? nn nhi?u l?n.  Qu v? b? kh th?. Tm t?t  T?ng huy?t p l khi l?c b?m mu qua ??ng m?ch c?a qu v? qu m?nh. N?u tnh tr?ng ny khng ???c ki?m sot, n c th? khi?n qu v? g?p ph?i nguy c? bi?n ch?ng nghim tr?ng.  Huy?t p m?c tiu c nhn c?a qu v? c th? khc nhau ty thu?c v tnh  tr?ng b?nh l, tu?i v cc nhn t? khc. ??i v?i h?u h?t m?i ng??i, huy?t p bnh th??ng l d??i 120/80.  ?i?u tr? t?ng huy?t p b?ng cch thay ??i l?i s?ng, dng thu?c, ho?c k?t h?p c? hai. Thay ??i l?i s?ng bao g?m gi?m cn, ?n ch? ?? ?n c l?i cho s?c kh?e, t mu?i, t?p th? d?c nhi?u h?n v h?n ch? u?ng r??u. Thng tin ny khng nh?m m?c ?ch thay th? cho l?i khuyn m chuyn gia ch?m [place] s?c kh?e ni v?i qu v?. Hy b?o ??m qu v? ph?i th?o lu?n b?t k? v?n ?? g m qu v? c v?i chuyn gia ch?m [place] s?c kh?e c?a qu v?. Document Released: 02/24/2005 Document Revised: 02/06/2016 Document Reviewed: 02/06/2016 Elsevier Interactive Patient Education  2017 Elsevier Inc.     Agustina Caroli, MD [facility]

## 2016-06-03 LAB — CBC WITH DIFFERENTIAL/PLATELET
BASOS ABS: 0.1 10*3/uL (ref 0.0–0.2)
Basos: 1 %
EOS (ABSOLUTE): 0.1 10*3/uL (ref 0.0–0.4)
Eos: 3 %
HEMATOCRIT: 25.6 % — AB (ref 34.0–46.6)
Hemoglobin: 6.8 g/dL — CL (ref 11.1–15.9)
Immature Grans (Abs): 0 10*3/uL (ref 0.0–0.1)
Immature Granulocytes: 0 %
LYMPHS: 24 %
Lymphocytes Absolute: 1.3 10*3/uL (ref 0.7–3.1)
MCH: 15.8 pg — AB (ref 26.6–33.0)
MCHC: 26.6 g/dL — AB (ref 31.5–35.7)
MCV: 60 fL — AB (ref 79–97)
MONOCYTES: 10 %
Monocytes Absolute: 0.5 10*3/uL (ref 0.1–0.9)
Neutrophils Absolute: 3.2 10*3/uL (ref 1.4–7.0)
Neutrophils: 62 %
PLATELETS: 347 10*3/uL (ref 150–379)
RBC: 4.3 x10E6/uL (ref 3.77–5.28)
RDW: 20.6 % — AB (ref 12.3–15.4)
WBC: 5.2 10*3/uL (ref 3.4–10.8)

## 2016-06-03 LAB — LIPID PANEL
CHOL/HDL RATIO: 2.3 ratio (ref 0.0–4.4)
Cholesterol, Total: 125 mg/dL (ref 100–199)
HDL: 54 mg/dL (ref >39)
LDL Calculated: 61 mg/dL (ref 0–99)
Triglycerides: 52 mg/dL (ref 0–149)
VLDL Cholesterol Cal: 10 mg/dL (ref 5–40)

## 2016-06-03 LAB — COMPREHENSIVE METABOLIC PANEL
ALBUMIN: 4 g/dL (ref 3.5–5.5)
ALK PHOS: 69 IU/L (ref 39–117)
ALT: 12 IU/L (ref 0–32)
AST: 11 IU/L (ref 0–40)
Albumin/Globulin Ratio: 1.3 (ref 1.2–2.2)
BUN/Creatinine Ratio: 20 (ref 9–23)
BUN: 12 mg/dL (ref 6–24)
CHLORIDE: 102 mmol/L (ref 96–106)
CO2: 23 mmol/L (ref 18–29)
CREATININE: 0.61 mg/dL (ref 0.57–1.00)
Calcium: 8.4 mg/dL — ABNORMAL LOW (ref 8.7–10.2)
GFR calc Af Amer: 126 mL/min/{1.73_m2} (ref >59)
GFR calc non Af Amer: 109 mL/min/{1.73_m2} (ref >59)
GLUCOSE: 88 mg/dL (ref 65–99)
Globulin, Total: 3 g/dL (ref 1.5–4.5)
Potassium: 3.9 mmol/L (ref 3.5–5.2)
Sodium: 138 mmol/L (ref 134–144)
TOTAL PROTEIN: 7 g/dL (ref 6.0–8.5)

## 2016-06-03 LAB — HEMOGLOBIN A1C
Est. average glucose Bld gHb Est-mCnc: 117 mg/dL
HEMOGLOBIN A1C: 5.7 % — AB (ref 4.8–5.6)

## 2016-06-03 LAB — TSH: TSH: 2.7 u[IU]/mL (ref 0.450–4.500)

## 2016-06-03 LAB — HIV ANTIBODY (ROUTINE TESTING W REFLEX): HIV Screen 4th Generation wRfx: NONREACTIVE

## 2016-06-03 LAB — HEPATITIS C ANTIBODY: Hep C Virus Ab: <0.1 s/co ratio (ref 0.0–0.9)

## 2016-06-04 ENCOUNTER — Other Ambulatory Visit: Payer: Self-pay | Admitting: Emergency Medicine

## 2016-06-04 DIAGNOSIS — D509 Iron deficiency anemia, unspecified: Secondary | ICD-10-CM

## 2016-06-04 MED ORDER — FERROUS SULFATE 325 (65 FE) MG PO TABS: 325.0000 mg | ORAL_TABLET | Freq: Every day | ORAL | 3 refills | Status: DC

## 2016-06-06 ENCOUNTER — Encounter: Payer: Self-pay | Admitting: *Deleted

## 2016-06-12 ENCOUNTER — Telehealth: Payer: Self-pay | Admitting: Oncology

## 2016-06-12 ENCOUNTER — Encounter: Payer: Self-pay | Admitting: Oncology

## 2016-06-12 NOTE — Telephone Encounter (Signed)
Appt has been scheduled for the pt to see Dr. Windell Moment on 4/12 at 11am. Letter mailed to the pt about the appt date and time. Msg sent to the referral coordinator.

## 2016-09-11 ENCOUNTER — Ambulatory Visit (INDEPENDENT_AMBULATORY_CARE_PROVIDER_SITE_OTHER): Payer: BLUE CROSS/BLUE SHIELD | Admitting: Physician Assistant

## 2016-09-11 ENCOUNTER — Ambulatory Visit (INDEPENDENT_AMBULATORY_CARE_PROVIDER_SITE_OTHER): Payer: BLUE CROSS/BLUE SHIELD

## 2016-09-11 VITALS — BP 120/68 | HR 75 | Temp 98.4°F | Resp 16 | Ht 60.5 in | Wt 106.4 lb

## 2016-09-11 DIAGNOSIS — R05 Cough: Secondary | ICD-10-CM | POA: Diagnosis not present

## 2016-09-11 DIAGNOSIS — D649 Anemia, unspecified: Secondary | ICD-10-CM

## 2016-09-11 DIAGNOSIS — R059 Cough, unspecified: Secondary | ICD-10-CM

## 2016-09-11 DIAGNOSIS — I10 Essential (primary) hypertension: Secondary | ICD-10-CM | POA: Diagnosis not present

## 2016-09-11 DIAGNOSIS — R7309 Other abnormal glucose: Secondary | ICD-10-CM | POA: Diagnosis not present

## 2016-09-11 DIAGNOSIS — K5900 Constipation, unspecified: Secondary | ICD-10-CM

## 2016-09-11 MED ORDER — MUCINEX DM MAXIMUM STRENGTH 60-1200 MG PO TB12: 1.0000 | ORAL_TABLET | Freq: Two times a day (BID) | ORAL | 0 refills | Status: DC

## 2016-09-11 MED ORDER — HYDROCOD POLST-CPM POLST ER 10-8 MG/5ML PO SUER: 5.0000 mL | Freq: Two times a day (BID) | ORAL | 0 refills | Status: DC | PRN

## 2016-09-11 MED ORDER — AZELASTINE HCL 0.1 % NA SOLN: 2.0000 | Freq: Two times a day (BID) | NASAL | 0 refills | Status: DC

## 2016-09-11 NOTE — Patient Instructions (Addendum)
Happy [place] OuchBirthday!!   Return the cards when you have collected the specimens.  When I get the blood test results, we will see if you need to see the blood specialist (hematologist) to help us address the anemia.   To help reduce constipation and promote bowel health, 1. Drink at least 64 ounces of water each day; 2. Eat plenty of fiber (fruits, vegetables, whole grains, legumes) 3. Get plenty of physical activity  If needed, use a stool softener (docusate) or an osmotic laxative (like Miralax) each day, or as needed.       IF you received an x-ray today, you will receive an invoice from [facility]. Please contact [facility] at [phone] with questions or concerns regarding your invoice.   IF you received labwork today, you will receive an invoice from [facility]. Please contact [facility] at [phone] with questions or concerns regarding your invoice.   Our billing staff will not be able to assist you with questions regarding bills from these companies.  You will be contacted with the lab results as soon as they are available. The fastest way to get your results is to activate your My Chart account. Instructions are located on the last page of this paperwork. If you have not heard from us regarding the results in 2 weeks, please contact this office.

## 2016-09-11 NOTE — Progress Notes (Signed)
Subjective:    Patient ID: Raven Cooper, female    DOB: 1970/04/08, 46 y.o.   MRN: [id number] PCP: System, Pcp Not In Chief Complaint  Patient presents with  . Cough    HPI: 46 y/o F presents for cough and runny nose x2 weeks. Patient is not a smoker.  Cough is productive and yellow.  Sore throat. Some stomach ache. No vomiting, or diarrhea. Has not been able to have a BM in 3 days.  Subjective Fever. No headaches, ear pain, sinus pressure. No SOB or chest pain.  Taken Dayquil and tylenol with some relief.  Not sleeping well because of cough. More tired than normal.   Patient was evaluated here in march 2018. CBC revealed Hgb of 6.8, Hct 25.6, MCV 60, MCH 15.8, MCHC 26.6, RDW 20.6. WBCs and RBCs were within normal range. Patient was referred to hematology/oncology. Patient had appointment scheduled for 06/19/2016 but never went.   Patient Active Problem List   Diagnosis Date Noted  . Blood pressure elevated without history of HTN 06/02/2016  . Heart murmur 06/02/2016  . Encounter for general adult medical examination with abnormal findings 06/02/2016   No past medical history on file. Prior to Admission medications   Medication Sig Start Date End Date Taking? Authorizing Provider  acetaminophen (TYLENOL) 500 MG tablet Take 500 mg by mouth every 6 (six) hours as needed.    [provider]  ferrous sulfate (FEOSOL) 325 (65 FE) MG tablet Take 1 tablet (325 mg total) by mouth daily with breakfast. 06/04/16 07/04/16  Georgina Quint, MD  glucosamine-chondroitin 500-400 MG tablet Take 1 tablet by mouth 3 (three) times daily.    [provider]  lisinopril-hydrochlorothiazide (PRINZIDE,ZESTORETIC) 10-12.5 MG tablet Take 1 tablet by mouth daily. 06/02/16 07/02/16  Georgina Quint, MD   No Known Allergies  Review of Systems  Constitutional: Positive for chills and fatigue.  HENT: Positive for congestion, rhinorrhea and sore throat.   Respiratory: Positive for  cough. Negative for shortness of breath.   Cardiovascular: Negative for chest pain.  Gastrointestinal: Positive for abdominal pain and constipation. Negative for diarrhea, nausea and vomiting.  Neurological: Positive for dizziness. Negative for light-headedness and headaches.  Psychiatric/Behavioral: Positive for sleep disturbance.       Objective:   Physical Exam  Constitutional: She is oriented to person, place, and time. She appears well-developed and well-nourished. No distress.  BP 120/68   Pulse 75   Temp 98.4 F (36.9 C) (Oral)   Resp 16   Ht 5' 0.5" (1.537 m)   Wt 106 lb 6.4 oz (48.3 kg)   SpO2 100%   BMI 20.44 kg/m    HENT:  Head: Normocephalic and atraumatic.  Right Ear: External ear normal.  Left Ear: External ear normal.  Nose: Nose normal.  Mouth/Throat: Oropharynx is clear and moist. No oropharyngeal exudate.  Eyes: Conjunctivae and EOM are normal. Pupils are equal, round, and reactive to light.  Neck: No thyromegaly present.  Cardiovascular: Normal rate and regular rhythm.   Murmur (systolic) heard. Pulmonary/Chest: Effort normal and breath sounds normal. No respiratory distress.  Abdominal: Soft. Bowel sounds are normal.  Lymphadenopathy:    She has no cervical adenopathy.  Neurological: She is alert and oriented to person, place, and time.  Skin: Skin is warm and dry. She is not diaphoretic.  Psychiatric: She has a normal mood and affect. Her behavior is normal. Judgment and thought content normal.   Dg Chest 2 View  Result Date:  09/11/2016 CLINICAL DATA:  Productive cough for 2 weeks. EXAM: CHEST  2 VIEW COMPARISON:  None. FINDINGS: The cardiomediastinal silhouette is within normal limits. The lungs are well inflated and clear. There is no evidence of pleural effusion or pneumothorax. No acute osseous abnormality is identified. IMPRESSION: No active cardiopulmonary disease. Electronically Signed   By: Sebastian AcheAllen  Grady M.D.   On: 09/11/2016 11:37         Assessment & Plan:  1. Cough Productive cough x 2 weeks. Clear lungs but chance patient is immunocompromised. Will r/o infectious process with CXR. Treating symptoms of URI with cough suppressant, and nasal decongestant.  - DG Chest 2 View; Future - Dextromethorphan-Guaifenesin (MUCINEX DM MAXIMUM STRENGTH) 60-1200 MG TB12; Take 1 tablet by mouth every 12 (twelve) hours.  Dispense: 20 each; Refill: 0 - chlorpheniramine-HYDROcodone (TUSSIONEX PENNKINETIC ER) 10-8 MG/5ML SUER; Take 5 mLs by mouth every 12 (twelve) hours as needed for cough.  Dispense: 100 mL; Refill: 0 - azelastine (ASTELIN) 0.1 % nasal spray; Place 2 sprays into both nostrils 2 (two) times daily. Use in each nostril as directed  Dispense: 30 mL; Refill: 0  2. Anemia, unspecified type Hemoglobin at last visit in March 2018. Re-evaluate for cause of anemia.  - Fe+TIBC+Fer - CBC with Differential/Platelet - POC Hemoccult Bld/Stl (3-Cd Home Screen); Future  3. Constipation, unspecified constipation type 4. Essential hypertension Well controlled with current medication. Continue taking Lisinopril-HCTZ as prescribed.   5. Elevated hemoglobin A1c Hemoglobin A1c mildly elevated in March 2018. Re-evaluate today to check for diabetes.  - Hemoglobin A1c  Return if symptoms worsen or fail to improve.

## 2016-09-11 NOTE — Progress Notes (Signed)
Patient ID: Raven Cooper, female    DOB: 10/12/1970, 46 y.o.   MRN: [id number]  PCP: System, Pcp Not In  Chief Complaint  Patient presents with  . Cough    Subjective:   Presents for evaluation of cough x 2 weeks. She is accompanied by her husband. [place] language interpretation used initially, but patient prefers a phone interpreter, used later.  She relates 2 weeks of cough and runny nose. Cough produces yellow sputum. Associated symptoms include subjective fever, fatigue, sore throat, abdominal discomfort and constipation (x 3 days). Cough keeps her from sleeping well. No chills, nausea, vomiting, diarrhea. No HA, ear pain, SOB, CP. OTC DayQuil and NyQuil with some benefit  She was seen here 06/02/2016 relating fatigue and poor sleep. She was noted to be hypertensive and had a systolic murmur on exam. EKG was normal. She was started on lisinoprilHCTZ, screening labs were drawn and she was referred to GYN.  Labs revealed: A1C 5.7% TSH 2.700 Lipids TC 125, TG 52, HDL 54, LDL 61 CBC WBC 5.2, HGB 6.8, HCT 25.6, MCV 60, MCHC 26.6, RDW 20.6 CMET GLucose 88, BUN 12, Cr 0.61, Na 138, K 3.9, Ca 8.4, AST 11, ALT 12 HIV NON-REACTIVE  She was referred to hematology, but no showed for her 4/12 appointment with Dr. Windell MomentKonda.    Review of Systems  Constitutional: Positive for chills, fatigue and fever. Negative for unexpected weight change.  HENT: Positive for congestion and sore throat. Negative for ear discharge, ear pain, sinus pain and trouble swallowing.   Eyes: Negative for visual disturbance.  Respiratory: Positive for cough. Negative for chest tightness and shortness of breath.   Cardiovascular: Negative for chest pain and palpitations.  Gastrointestinal: Positive for abdominal pain and constipation. Negative for abdominal distention, anal bleeding, blood in stool, diarrhea, nausea, rectal pain and vomiting.  Endocrine: Negative.   Genitourinary: Negative for dysuria,  frequency and urgency.  Musculoskeletal: Negative for arthralgias and myalgias.  Allergic/Immunologic: Negative for environmental allergies and food allergies.  Neurological: Positive for dizziness. Negative for weakness, light-headedness and headaches.  Psychiatric/Behavioral: Positive for sleep disturbance.       Patient Active Problem List   Diagnosis Date Noted  . Anemia 09/11/2016  . Blood pressure elevated without history of HTN 06/02/2016  . Heart murmur 06/02/2016     Prior to Admission medications   Medication Sig Start Date End Date Taking? Authorizing Provider  acetaminophen (TYLENOL) 500 MG tablet Take 500 mg by mouth every 6 (six) hours as needed.   Yes [provider]  ferrous sulfate (FEOSOL) 325 (65 FE) MG tablet Take 1 tablet (325 mg total) by mouth daily with breakfast. 06/04/16 07/04/16 no Georgina QuintSagardia, Miguel Jose, MD  glucosamine-chondroitin 500-400 MG tablet Take 1 tablet by mouth 3 (three) times daily.   yes [provider]  lisinopril-hydrochlorothiazide (PRINZIDE,ZESTORETIC) 10-12.5 MG tablet Take 1 tablet by mouth daily. 06/02/16 07/02/16 yes Sagardia, Eilleen KempfMiguel Jose, MD     No Known Allergies     Objective:  Physical Exam  Constitutional: She is oriented to person, place, and time. She appears well-developed and well-nourished. She is active and cooperative. No distress.  BP 120/68   Pulse 75   Temp 98.4 F (36.9 C) (Oral)   Resp 16   Ht 5' 0.5" (1.537 m)   Wt 106 lb 6.4 oz (48.3 kg)   SpO2 100%   BMI 20.44 kg/m   HENT:  Head: Normocephalic and atraumatic.  Right Ear: Hearing, tympanic membrane,  external ear and ear canal normal.  Left Ear: Hearing, tympanic membrane, external ear and ear canal normal.  Nose: Nose normal. Right sinus exhibits no maxillary sinus tenderness and no frontal sinus tenderness. Left sinus exhibits no maxillary sinus tenderness and no frontal sinus tenderness.  Mouth/Throat: Uvula is midline, oropharynx is  clear and moist and mucous membranes are normal. No oral lesions.  Eyes: Conjunctivae are normal. No scleral icterus.  Neck: Normal range of motion. Neck supple. No thyromegaly present.  Cardiovascular: Normal rate and regular rhythm.   Murmur heard.  Systolic murmur is present with a grade of 2/6  Pulses:      Radial pulses are 2+ on the right side, and 2+ on the left side.  Pulmonary/Chest: Effort normal and breath sounds normal.  Lymphadenopathy:       Head (right side): No tonsillar, no preauricular, no posterior auricular and no occipital adenopathy present.       Head (left side): No tonsillar, no preauricular, no posterior auricular and no occipital adenopathy present.    She has no cervical adenopathy.       Right: No supraclavicular adenopathy present.       Left: No supraclavicular adenopathy present.  Neurological: She is alert and oriented to person, place, and time. No sensory deficit.  Skin: Skin is warm, dry and intact. No rash noted. No cyanosis or erythema. Nails show no clubbing.  Psychiatric: She has a normal mood and affect. Her speech is normal and behavior is normal.       Dg Chest 2 View  Result Date: 09/11/2016 CLINICAL DATA:  Productive cough for 2 weeks. EXAM: CHEST  2 VIEW COMPARISON:  None. FINDINGS: The cardiomediastinal silhouette is within normal limits. The lungs are well inflated and clear. There is no evidence of pleural effusion or pneumothorax. No acute osseous abnormality is identified. IMPRESSION: No active cardiopulmonary disease. Electronically Signed   By: Sebastian Ache M.D.   On: 09/11/2016 11:37       Assessment & Plan:   Problem List Items Addressed This Visit    Benign essential HTN    COntrolled. Continue current treatment.      Anemia    Repeat CBC, add Fe, TBC and ferritin. Home hemoccult cards.  Will likely re-refer to hematology.      Relevant Orders   Fe+TIBC+Fer (Completed)   CBC with Differential/Platelet (Completed)    POC Hemoccult Bld/Stl (3-Cd Home Screen)    Other Visit Diagnoses    Cough    -  Primary   reassuring CXR. Likely due to post-nasal drainage. Anticipatory guidance. Supportive care.   Relevant Medications   Dextromethorphan-Guaifenesin (MUCINEX DM MAXIMUM STRENGTH) 60-1200 MG TB12   chlorpheniramine-HYDROcodone (TUSSIONEX PENNKINETIC ER) 10-8 MG/5ML SUER   azelastine (ASTELIN) 0.1 % nasal spray   Other Relevant Orders   DG Chest 2 View (Completed)   Constipation, unspecified constipation type       Fluids, fiber, physical activity. OTC Miralax.   Essential hypertension       Elevated hemoglobin A1c       Relevant Orders   Hemoglobin A1c (Completed)       Return if symptoms worsen or fail to improve.   Fernande Bras, PA-C [facility]

## 2016-09-12 LAB — CBC WITH DIFFERENTIAL/PLATELET
BASOS: 1 %
Basophils Absolute: 0 10*3/uL (ref 0.0–0.2)
EOS (ABSOLUTE): 0.2 10*3/uL (ref 0.0–0.4)
Eos: 6 %
HEMATOCRIT: 26.3 % — AB (ref 34.0–46.6)
Hemoglobin: 7.4 g/dL — ABNORMAL LOW (ref 11.1–15.9)
IMMATURE GRANULOCYTES: 0 %
Immature Grans (Abs): 0 10*3/uL (ref 0.0–0.1)
LYMPHS ABS: 1.4 10*3/uL (ref 0.7–3.1)
Lymphs: 38 %
MCH: 17.9 pg — ABNORMAL LOW (ref 26.6–33.0)
MCHC: 28.1 g/dL — AB (ref 31.5–35.7)
MCV: 64 fL — AB (ref 79–97)
MONOS ABS: 0.3 10*3/uL (ref 0.1–0.9)
Monocytes: 9 %
NEUTROS PCT: 46 %
Neutrophils Absolute: 1.7 10*3/uL (ref 1.4–7.0)
PLATELETS: 347 10*3/uL (ref 150–379)
RBC: 4.14 x10E6/uL (ref 3.77–5.28)
RDW: 20.4 % — AB (ref 12.3–15.4)
WBC: 3.6 10*3/uL (ref 3.4–10.8)

## 2016-09-12 LAB — FE+TIBC+FER
Ferritin: 5 ng/mL — ABNORMAL LOW (ref 15–150)
IRON SATURATION: 4 % — AB (ref 15–55)
IRON: 16 ug/dL — AB (ref 27–159)
Total Iron Binding Capacity: 383 ug/dL (ref 250–450)
UIBC: 367 ug/dL (ref 131–425)

## 2016-09-12 LAB — HEMOGLOBIN A1C
Est. average glucose Bld gHb Est-mCnc: 120 mg/dL
Hgb A1c MFr Bld: 5.8 % — ABNORMAL HIGH (ref 4.8–5.6)

## 2016-09-13 NOTE — Assessment & Plan Note (Signed)
COntrolled. Continue current treatment. 

## 2016-09-13 NOTE — Assessment & Plan Note (Signed)
Repeat CBC, add Fe, TBC and ferritin. Home hemoccult cards.  Will likely re-refer to hematology.

## 2016-09-16 ENCOUNTER — Telehealth: Payer: Self-pay

## 2016-09-16 ENCOUNTER — Encounter: Payer: Self-pay | Admitting: Radiology

## 2016-09-16 NOTE — Progress Notes (Signed)
Attempted to call pt and no answer. Sending a unable to reach letter.

## 2016-09-16 NOTE — Progress Notes (Signed)
Attempted to call pt with no answer. Will send unable reach letter.

## 2016-12-08 ENCOUNTER — Other Ambulatory Visit: Payer: Self-pay | Admitting: Emergency Medicine

## 2017-03-05 NOTE — Telephone Encounter (Signed)
Error-Sign Encounter 

## 2017-06-29 ENCOUNTER — Encounter: Payer: Self-pay | Admitting: Physician Assistant

## 2017-06-29 ENCOUNTER — Ambulatory Visit: Payer: BLUE CROSS/BLUE SHIELD | Admitting: Physician Assistant

## 2017-06-29 VITALS — BP 110/64 | HR 74 | Temp 97.7°F | Resp 16 | Ht 60.0 in | Wt 110.0 lb

## 2017-06-29 DIAGNOSIS — Z9109 Other allergy status, other than to drugs and biological substances: Secondary | ICD-10-CM | POA: Diagnosis not present

## 2017-06-29 MED ORDER — FLUTICASONE PROPIONATE 50 MCG/ACT NA SUSP: 2.0000 | Freq: Every day | NASAL | 12 refills | Status: DC

## 2017-06-29 MED ORDER — CETIRIZINE HCL 10 MG PO TABS: 10.0000 mg | ORAL_TABLET | Freq: Every day | ORAL | 11 refills | Status: AC

## 2017-06-29 MED ORDER — BENZONATATE 100 MG PO CAPS: 100.0000 mg | ORAL_CAPSULE | Freq: Three times a day (TID) | ORAL | 0 refills | Status: DC | PRN

## 2017-06-29 NOTE — Progress Notes (Signed)
PRIMARY CARE AT [facility] [address], [place] [address] [phone]  Date:  06/29/2017   Name:  Raven Cooper   DOB:  04-05-1970   MRN:  [id number]  PCP:  System, Pcp Not In    History of Present Illness:  Raven Cooper is a 47 y.o. female patient who presents to PCP with  Chief Complaint  Patient presents with  . Nasal Congestion    x 2 wks  . Cough    x last night     She has had cough and congestion for 2 weeks.  She has some yellow mucus.  She has some pain at the bridge of the nose.  She has subjective fever and chills.  No sore throat.   She has taken the nasal spray and the alka seltzer plus which helped.   She has some sneezing.   She thinks she has some allergies to the pollen..  She states that ultimately, with the nasal spray, she feels much better.    Patient Active Problem List   Diagnosis Date Noted  . Anemia 09/11/2016  . Benign essential HTN 06/02/2016  . Heart murmur 06/02/2016    History reviewed. No pertinent past medical history.  History reviewed. No pertinent surgical history.  Social History   Tobacco Use  . Smoking status: Never Smoker  . Smokeless tobacco: Never Used  Substance Use Topics  . Alcohol use: No  . Drug use: No    History reviewed. No pertinent family history.  No Known Allergies  Medication list has been reviewed and updated.  Current Outpatient Medications on File Prior to Visit  Medication Sig Dispense Refill  . acetaminophen (TYLENOL) 500 MG tablet Take 500 mg by mouth every 6 (six) hours as needed.    Marland Kitchen glucosamine-chondroitin 500-400 MG tablet Take 1 tablet by mouth daily.     Marland Kitchen azelastine (ASTELIN) 0.1 % nasal spray Place 2 sprays into both nostrils 2 (two) times daily. Use in each nostril as directed (Patient not taking: Reported on 06/29/2017) 30 mL 0  . ferrous sulfate (FEOSOL) 325 (65 FE) MG tablet Take 1 tablet (325 mg total) by mouth daily with breakfast. 90 tablet 3  . lisinopril-hydrochlorothiazide  (PRINZIDE,ZESTORETIC) 10-12.5 MG tablet TAKE 1 TABLET BY MOUTH DAILY 30 tablet 0   No current facility-administered medications on file prior to visit.     ROS ROS otherwise unremarkable unless listed above.  Physical Examination: BP 110/64   Pulse 74   Temp 97.7 F (36.5 C) (Oral)   Resp 16   Ht 5' (1.524 m)   Wt 110 lb (49.9 kg)   SpO2 99%   BMI 21.48 kg/m  Ideal Body Weight: Weight in (lb) to have BMI = 25: 127.7  Physical Exam  Constitutional: She is oriented to person, place, and time. She appears well-developed and well-nourished. No distress.  HENT:  Head: Normocephalic and atraumatic.  Right Ear: Tympanic membrane, external ear and ear canal normal.  Left Ear: Tympanic membrane, external ear and ear canal normal.  Nose: Mucosal edema and rhinorrhea present. Right sinus exhibits no maxillary sinus tenderness and no frontal sinus tenderness. Left sinus exhibits no maxillary sinus tenderness and no frontal sinus tenderness.  Mouth/Throat: No uvula swelling. No oropharyngeal exudate, posterior oropharyngeal edema or posterior oropharyngeal erythema.  Eyes: Pupils are equal, round, and reactive to light. Conjunctivae and EOM are normal.  Cardiovascular: Normal rate and regular rhythm. Exam reveals no gallop, no distant heart sounds and no friction rub.  No murmur heard. Pulmonary/Chest: Effort normal. No respiratory distress. She has no decreased breath sounds. She has no wheezes. She has no rhonchi.  Lymphadenopathy:       Head (right side): No submandibular, no tonsillar, no preauricular and no posterior auricular adenopathy present.       Head (left side): No submandibular, no tonsillar, no preauricular and no posterior auricular adenopathy present.  Neurological: She is alert and oriented to person, place, and time.  Skin: She is not diaphoretic.  Psychiatric: She has a normal mood and affect. Her behavior is normal.     Assessment and Plan: Raven Cooper is a 47  y.o. female who is here today for cc of  Chief Complaint  Patient presents with  . Nasal Congestion    x 2 wks  . Cough    x last night   Environmental allergies - Plan: cetirizine (ZYRTEC) 10 MG tablet, fluticasone (FLONASE) 50 MCG/ACT nasal spray, benzonatate (TESSALON) 100 MG capsule  Trena PlattStephanie English, PA-C [facility] 4/24/20198:00 AM

## 2017-06-29 NOTE — Patient Instructions (Addendum)
Please hydrate well with 64 oz of water if not more.  Please take the medication as prescribed.  Vim m?i d? ?ng, Ng??i l?n Allergic Rhinitis, Adult Vim m?i d? ?ng l m?t ph?n ?ng d? ?ng ?nh h??ng ??n nim m?c bn trong m?i. Tnh tr?ng ny gy h?t h?i, ch?y n??c m?i ho?c ngh?t m?i v c?m th?y d?ch nh?y ?i xu?ng thnh sau h?ng (ch?y n??c m?i sau). Vim m?i d? ?ng c th? t? nh? ??n n?ng. C hai lo?i vim m?i d? ?ng:  Theo ma. Lo?i ny c?ng ???c g?i l s?t ma c? kh. N ch? x?y ra trong nh?ng ma nh?t ??nh.  Quanh n?m. Lo?i vim m?i d? ?ng ny x?y ra vo b?t k? th?i ?i?m no trong n?m.  Nguyn nhn g gy ra? Tnh tr?ng ny x?y ra khi h? th?ng b?o v? c? th? (h? mi?n d?ch) ph?n ?ng v?i m?t s? ch?t v h?i nh?t ??nh ???c g?i l cc ch?t gy d? ?ng nh? th? cc ch?t ny l m?m b?nh.  Vim m?i d? ?ng theo ma do ph?n hoa gy ra, ph?n hoa c th? ??n t? c?, cy v c? d?i. Vim m?i d? ?ng quanh n?m c th? l do:  M?t b?i trong nh.  V?y da v?t nui.  Bo t? n?m m?c.  Cc d?u hi?u ho?c tri?u ch?ng l g? Nh?ng tri?u ch?ng c?a tnh tr?ng ny bao g?m:  H?t h?i.  Ch?y n??c m?i ho?c ng?t m?i (ngh?t m?i).  S? mu?i pha sau.  Ng?a m?i.  Ch?y n??c m?t.  Kh ng?Marland Kitchen  Bu?n ng? vo ban ngy.  Ch?n ?on tnh tr?ng ny nh? th? no? Tnh tr?ng ny c th? ???c ch?n ?on d?a vo:  B?nh s? c?a qu v?.  Khm th?c th?.  Xt nghi?m ?? ki?m tra cc tnh tr?ng lin quan, ch?ng h?n nh?: ? Hen suy?n. ? B?nh ?au m?t ??. ? Nhi?m trng tai. ? Nhi?m trng ???ng h h?p trn.  Xt nghi?m ?? tm ra ch?t gy d? ?ng no gy ra cc tri?u ch?ng c?a qu v?. Cc xt nghi?m ny c th? bao g?m xt nghi?m mu ho?c xt nghi?m trn da.  Tnh tr?ng ny ???c ?i?u tr? nh? th? no? Khng c cch ?i?u tr? kh?i tnh tr?ng ny, nh?ng vi?c ?i?u tr? c th? gip ki?m sot cc tri?u ch?ng. ?i?u tr? c th? bao g?m:  Dng thu?c ?? ng?n ch?n cc tri?u ch?ng d? ?ng, ch?ng h?n nh? thu?c khng histamine. C th? dng thu?c d??i  d?ng thu?c tim, thu?c x?t m?i, ho?c thu?c d?ng vin u?ng.  Hessie Diener ch?t gy d? ?ng.  Kh? nh?y. Ph??ng php ?i?u tr? ny lin quan ??n vi?c tim lin t?c cho ??n khi c? th? c?a qu v? tr? ln t nh?y c?m h?n v?i ch?t gy d? ?ng. C th? th?c hi?n ph??ng php ?i?u tr? ny n?u cc ph??ng php ?i?u tr? khc khng c tc d?ng.  N?u dng thu?c v trnh ch?t gy d? ?ng khng c tc d?ng, c th? cc lo?i thu?c m?i, m?nh h?n s? ???c k ??n.  Tun th? nh?ng h??ng d?n ny ? nh:  Tm hi?u xem qu v? b? d? ?ng v?i ci g. Cc ch?t gy d? ?ng ph? bi?n l khi, b?i v ph?n hoa.  Trnh nh?ng th? qu v? b? d? ?ng. ?y l m?t s? vi?c qu v? c th? lm ?? gip trnh cc ch?t gy d? ?ng: ? Thay th? th?m b?ng sn g?, ? lt  ho?c nh?a vinyl. Th?m c th? dnh v?y da v b?i. ? Khng ht thu?c. Khng cho php ht thu?c trong nh qu v?. ? Thay b? l?c l s??i v ?i?u ho khng kh t nh?t l m?i thng m?t l?n. ? Trong ma d? ?ng:  ?ng c?a s? cng nhi?u cng t?t.  Ln k? ho?ch cho cc ho?t ??ng ngoi tr?i khi l??ng ph?n hoa ? m?c th?p nh?t. Th?i ?i?m ny th??ng l trong cc gi? vo bu?i t?i.  Khi vo trong nh, hy thay qu?n o v t?m r?a tr??c khi ng?i ln gh? ho?c ga tr?i gi??ng.  Ch? s? d?ng thu?c khng k ??n v thu?c k ??n theo ch? d?n c?a chuyn gia ch?m [place] s?c kh?e.  Tun th? t?t c? cc l?n khm theo di theo ch? d?n c?a chuyn gia ch?m [place] s?c kh?e. ?i?u ny c vai tr quan tr?ng. Hy lin l?c v?i chuyn gia ch?m [place] s?c kh?e n?u:  Qu v? b? s?t.  Qu v? b? ho dai d?ng.  Qu v? t?o ra ti?ng so khi th? (qu v? th? kh kh).  Cc tri?u ch?ng gy tr? ng?i cho cc sinh ho?t th??ng ngy c?a qu v?. Yu c?u tr? gip ngay l?p t?c n?u:  Qu v? b? kh th?. Tm t?t  Tnh tr?ng ny c th? x? tr ???c b?ng cch dng cc lo?i thu?c theo ch? d?n v trnh cc ch?t gy d? ?ng.  Hy lin l?c v?i chuyn gia ch?m [place] s?c kh?e c?a qu v? n?u qu v? b? ho ho?c s?t dai d?ng.  Trong ma d? ?ng, hy ?ng c?a s?  cng nhi?u cng t?t. Thng tin ny khng nh?m m?c ?ch thay th? cho l?i khuyn m chuyn gia ch?m [place] s?c kh?e ni v?i qu v?. Hy b?o ??m qu v? ph?i th?o lu?n b?t k? v?n ?? g m qu v? c v?i chuyn gia ch?m [place] s?c kh?e c?a qu v?. Document Released: 06/18/2015 Document Revised: 06/09/2016 Document Reviewed: 06/09/2016 Elsevier Interactive Patient Education  2018 [employer].    IF you received an x-ray today, you will receive an invoice from  [facility]. Please contact [facility] at [phone] with questions or concerns regarding your invoice.   IF you received labwork today, you will receive an invoice from [facility]. Please contact [facility] at [phone] with questions or concerns regarding your invoice.   Our billing staff will not be able to assist you with questions regarding bills from these companies.  You will be contacted with the lab results as soon as they are available. The fastest way to get your results is to activate your My Chart account. Instructions are located on the last page of this paperwork. If you have not heard from [place] regarding the results in 2 weeks, please contact this office.    '

## 2017-10-27 ENCOUNTER — Other Ambulatory Visit: Payer: Self-pay

## 2017-10-27 ENCOUNTER — Ambulatory Visit: Payer: BLUE CROSS/BLUE SHIELD | Admitting: Family Medicine

## 2017-10-27 ENCOUNTER — Ambulatory Visit (INDEPENDENT_AMBULATORY_CARE_PROVIDER_SITE_OTHER): Payer: BLUE CROSS/BLUE SHIELD

## 2017-10-27 ENCOUNTER — Encounter: Payer: Self-pay | Admitting: Family Medicine

## 2017-10-27 VITALS — BP 106/71 | HR 61 | Temp 98.6°F | Ht 60.63 in | Wt 108.8 lb

## 2017-10-27 DIAGNOSIS — G8929 Other chronic pain: Secondary | ICD-10-CM

## 2017-10-27 DIAGNOSIS — M25532 Pain in left wrist: Secondary | ICD-10-CM

## 2017-10-27 DIAGNOSIS — M654 Radial styloid tenosynovitis [de Quervain]: Secondary | ICD-10-CM

## 2017-10-27 DIAGNOSIS — M7989 Other specified soft tissue disorders: Secondary | ICD-10-CM | POA: Diagnosis not present

## 2017-10-27 MED ORDER — NAPROXEN 500 MG PO TABS: 500.0000 mg | ORAL_TABLET | Freq: Two times a day (BID) | ORAL | 0 refills | Status: DC

## 2017-10-27 NOTE — Patient Instructions (Addendum)
If you have lab work done today you will be contacted with your lab results within the next 2 weeks.  If you have not heard from us then please contact us. The fastest way to get your results is to register for My Chart.   IF you received an x-ray today, you will receive an invoice from [facility]. Please contact [facility] at [phone] with questions or concerns regarding your invoice.   IF you received labwork today, you will receive an invoice from [facility]. Please contact [facility] at [phone] with questions or concerns regarding your invoice.   Our billing staff will not be able to assist you with questions regarding bills from these companies.  You will be contacted with the lab results as soon as they are available. The fastest way to get your results is to activate your My Chart account. Instructions are located on the last page of this paperwork. If you have not heard from us regarding the results in 2 weeks, please contact this office.    tensDe Quervain Tenosynovitis Tendons attach muscles to bones. They also help with joint movements. When tendons become irritated or swollen, it is called tendinitis. The extensor pollicis brevis (EPB) tendon connects the EPB muscle to a bone that is near the base of the thumb. The EPB muscle helps to straighten and extend the thumb. De Quervain tenosynovitis is a condition in which the EPB tendon lining (sheath) becomes irritated, thickened, and swollen. This condition is sometimes called stenosing tenosynovitis. This condition causes pain on the thumb side of the back of the wrist. What are the causes? Causes of this condition include:  Activities that repeatedly cause your thumb and wrist to extend.  A sudden increase in activity or change in activity that affects your wrist.  What increases the risk? This condition is more likely to develop in:  Females.  People who have diabetes.  Women who have recently  given birth.  People who are over 48 years of age.  People who do activities that involve repeated hand and wrist motions, such as tennis, racquetball, volleyball, gardening, and taking care of children.  People who do heavy labor.  People who have poor wrist strength and flexibility.  People who do not warm up properly before activities.  What are the signs or symptoms? Symptoms of this condition include:  Pain or tenderness over the thumb side of the back of the wrist when your thumb and wrist are not moving.  Pain that gets worse when you straighten your thumb or extend your thumb or wrist.  Pain when the injured area is touched.  Locking or catching of the thumb joint while you bend and straighten your thumb.  Decreased thumb motion due to pain.  Swelling over the affected area.  How is this diagnosed? This condition is diagnosed with a medical history and physical exam. Your health care provider will ask for details about your injury and ask about your symptoms. How is this treated? Treatment may include the use of icing and medicines to reduce pain and swelling. You may also be advised to wear a splint or brace to limit your thumb and wrist motion. In less severe cases, treatment may also include working with a physical therapist to strengthen your wrist and calm the irritation around your EPB tendon sheath. In severe cases, surgery may be needed. Follow these instructions at home: If you have a splint or brace:  Wear it as told by your  health care provider. Remove it only as told by your health care provider.  Loosen the splint or brace if your fingers become numb and tingle, or if they turn cold and blue.  Keep the splint or brace clean and dry. Managing pain, stiffness, and swelling  If directed, apply ice to the injured area. ? Put ice in a plastic bag. ? Place a towel between your skin and the bag. ? Leave the ice on for 20 minutes, 2-3 times per day.  Move  your fingers often to avoid stiffness and to lessen swelling.  Raise (elevate) the injured area above the level of your heart while you are sitting or lying down. General instructions  Return to your normal activities as told by your health care provider. Ask your health care provider what activities are safe for you.  Take over-the-counter and prescription medicines only as told by your health care provider.  Keep all follow-up visits as told by your health care provider. This is important.  Do not drive or operate heavy machinery while taking prescription pain medicine. Contact a health care provider if:  Your pain, tenderness, or swelling gets worse, even if you have had treatment.  You have numbness or tingling in your wrist, hand, or fingers on the injured side. This information is not intended to replace advice given to you by your health care provider. Make sure you discuss any questions you have with your health care provider. Document Released: 02/24/2005 Document Revised: 08/02/2015 Document Reviewed: 05/02/2014 Elsevier Interactive Patient Education  [employer]2018 Elsevier Inc.

## 2017-10-27 NOTE — Progress Notes (Signed)
8/20/201910:59 AM  Michelene GardenerPhuong Arrey 06/29/1970, 47 y.o. female [id number]  Chief Complaint  Patient presents with  . Pain    Left hand injury    HPI:   Patient is a 47 y.o. female with past medical history significant for carpal tunnel surgery who presents today for pain of left wrist  Left hand 2 months of severe pain in wrist Pain has started gradually, denies any injuries or inciting events Pain along radial side When she wakes up arms feels heavy and unable to move, also feels tingling  Has h/o carpal tunnel surgery both hands Current symptoms are similar to when she had previous carpal tunnel issues However currently having swelling of left wrist, sometimes wrist becomes red and hot as well Pain worse with twisting of wrist and thumb Unable to hold heavy objects No neck pain Has been using salnopas and bangay Has been taking motrin twice a day Not using wrist Above measures provide minimal relief 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 No No No     Depression screen [facility] 2/9 10/27/2017 06/29/2017 06/02/2016  Decreased Interest 0 0 0  Down, Depressed, Hopeless 0 0 0  PHQ - 2 Score 0 0 0    No Known Allergies  Prior to Admission medications   Medication Sig Start Date End Date Taking? Authorizing Provider  acetaminophen (TYLENOL) 500 MG tablet Take 500 mg by mouth every 6 (six) hours as needed.   Yes [provider]  cetirizine (ZYRTEC) 10 MG tablet Take 1 tablet (10 mg total) by mouth daily. 06/29/17  Yes Trena PlattEnglish, Stephanie D, PA  glucosamine-chondroitin 500-400 MG tablet Take 1 tablet by mouth daily.    Yes [provider]  lisinopril-hydrochlorothiazide (PRINZIDE,ZESTORETIC) 10-12.5 MG tablet TAKE 1 TABLET BY MOUTH DAILY 12/08/16 10/27/17 Yes Sagardia, Eilleen KempfMiguel Jose, MD  ferrous sulfate (FEOSOL) 325 (65 FE) MG tablet Take 1 tablet (325 mg total) by mouth daily with  breakfast. 06/04/16 07/04/16  Georgina QuintSagardia, Miguel Jose, MD  azelastine (ASTELIN) 0.1 % nasal spray Place 2 sprays into both nostrils 2 (two) times daily. Use in each nostril as directed Patient not taking: Reported on 06/29/2017 09/11/16 06/29/17  Porfirio OarJeffery, Chelle, PA-C    History reviewed. No pertinent past medical history.  History reviewed. No pertinent surgical history.  Social History   Tobacco Use  . Smoking status: Never Smoker  . Smokeless tobacco: Never Used  Substance Use Topics  . Alcohol use: No    History reviewed. No pertinent family history.  ROS Per hpi  OBJECTIVE:  Blood pressure 106/71, pulse 61, temperature 98.6 F (37 C), temperature source Oral, height 5' 0.63" (1.54 m), weight 108 lb 12.8 oz (49.4 kg), SpO2 100 %. Body mass index is 20.81 kg/m.   Physical Exam  Constitutional: She is oriented to person, place, and time.  HENT:  Head: Normocephalic and atraumatic.  Mouth/Throat: Mucous membranes are normal.  Eyes: Pupils are equal, round, and reactive to light. EOM are normal. No scleral icterus.  Neck: Neck supple.  Pulmonary/Chest: Effort normal.  Musculoskeletal:       Right wrist: Normal.       Left wrist: She exhibits tenderness (nonbony tenderness over radial side of wrist with mild swelling, no redness or warmth. + finklestein. - tinsel and phalen) and swelling.  Neurological: She is alert and oriented to person, place, and time.  Normal grip strength  Skin: Skin  is warm and dry.  Nursing note and vitals reviewed.    Dg Wrist 2 Views Left  Result Date: 10/27/2017 CLINICAL DATA:  Swelling, redness left wrist EXAM: LEFT WRIST - 2 VIEW COMPARISON:  None. FINDINGS: There is no evidence of fracture or dislocation. There is no evidence of arthropathy or other focal bone abnormality. Soft tissues are unremarkable. IMPRESSION: Negative. Electronically Signed   By: Charlett NoseKevin  Dover M.D.   On: 10/27/2017 11:23     ASSESSMENT and PLAN  1. Chronic pain of  left wrist Discussed supportive measures, new meds r/se/b and RTC precautions. Patient educational handout given. - DG Wrist 2 Views Left; Future - Thumb spica - Ambulatory referral to Hand Surgery  2. De Quervain's disease (tenosynovitis) - Ambulatory referral to Hand Surgery  Other orders - naproxen (NAPROSYN) 500 MG tablet; Take 1 tablet (500 mg total) by mouth 2 (two) times daily with a meal.  Return if symptoms worsen or fail to improve.    Myles LippsIrma M Santiago, MD [facility] [address] [place], [place] [address] Ph.  [phone] Fax [phone]

## 2017-11-04 ENCOUNTER — Ambulatory Visit (INDEPENDENT_AMBULATORY_CARE_PROVIDER_SITE_OTHER): Payer: Self-pay

## 2017-11-04 ENCOUNTER — Ambulatory Visit (INDEPENDENT_AMBULATORY_CARE_PROVIDER_SITE_OTHER): Payer: BLUE CROSS/BLUE SHIELD | Admitting: Orthopedic Surgery

## 2017-11-04 ENCOUNTER — Encounter (INDEPENDENT_AMBULATORY_CARE_PROVIDER_SITE_OTHER): Payer: Self-pay | Admitting: Orthopedic Surgery

## 2017-11-04 VITALS — BP 120/72 | HR 81 | Resp 14 | Ht 60.0 in | Wt 107.0 lb

## 2017-11-04 DIAGNOSIS — M25532 Pain in left wrist: Secondary | ICD-10-CM

## 2017-11-04 MED ORDER — DICLOFENAC SODIUM 1 % TD GEL: 2.0000 g | Freq: Four times a day (QID) | TRANSDERMAL | 0 refills | Status: AC

## 2017-11-04 NOTE — Progress Notes (Signed)
Office Visit Note   Patient: Raven Cooper           Date of Birth: 08-05-1970           MRN: [id number] Visit Date: 11/04/2017              Requested by: Myles Lipps, MD [address] [place], [place] [address] PCP: System, Pcp Not In   Assessment & Plan: Visit Diagnoses:  1. Pain in left wrist     Plan:  #1: At this time I plan is to obtain an MRI scan since her symptoms and pain all over the place and I cannot tell for sure her pathology.  It may be DeQuervain's disease her first [facility] arthritis.  It was very difficult to get a good exam she was pointing to numerous places.  It was difficult with the interpreter because he did not interpret as she spoke.  However at the end of the conversation she refused to have an MRI scan since she did not have any money. #2: I have expressed to her that certainly with her symptoms she needs to stop doing massaging and put the hand and wrist to rest. #3: She already has a brace and she will use that more consistently. #4: We will prescribe Voltaren gel to use 4 times a day to the area.   Follow-Up Instructions: Return in about 2 weeks (around 11/18/2017).   Orders:  Orders Placed This Encounter  Procedures  . XR Wrist Complete Left  . MR Wrist Left w/o contrast   Meds ordered this encounter  Medications  . diclofenac sodium (VOLTAREN) 1 % GEL    Sig: Apply 2-4 g topically 4 (four) times daily. To her hand and wrist area where painful    Dispense:  1 Tube    Refill:  0    Order Specific Question:   Supervising Provider    Answer:   Valeria Batman [[id number]]      Procedures: No procedures performed   Clinical Data: No additional findings.   Subjective: Chief Complaint  Patient presents with  . Left Wrist - Pain  . 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    HPI   This patient is a 47 year old female who presents with a 24-month history of pain in her left wrist.  She is [place] and does not speak [place].  She does get massage at work and has noted swelling and pain especially with motion.  She denies any history of injury or trauma.  She is wearing a thumb spica splint brace at this time but she takes it off to do her work.  Proximal and apparently is not much of help for her.  Her pain though when trying to talk to her is so diffuse that I cannot tell exactly where she hurts.  Even has pain up into the forearm.   Review of Systems  Constitutional: Positive for activity change.  HENT: Negative for trouble swallowing.   Eyes: Negative for pain.  Respiratory: Negative for shortness of breath.   Cardiovascular: Negative for chest pain.  Gastrointestinal: Negative for constipation.  Endocrine: Negative for cold intolerance.  Genitourinary: Negative for difficulty urinating.  Musculoskeletal: Positive for joint swelling.  Skin: Positive for rash.  Allergic/Immunologic: Negative for food allergies.  Neurological: Positive for numbness.  Hematological: Does not bruise/bleed easily.  Psychiatric/Behavioral: Positive for sleep disturbance.     Objective: Vital Signs: BP 120/72 (BP Location: Right Arm, Patient Position: Sitting, Cuff Size: Normal)   Pulse 81   Resp 14   Ht 5' (1.524 m)   Wt 107 lb (48.5 kg)   BMI 20.90 kg/m   Physical Exam  Constitutional: She is oriented to person, place, and time. She appears well-developed and well-nourished.  HENT:  Mouth/Throat: Oropharynx is clear and moist.  Eyes: Pupils are equal, round, and reactive to light. EOM are normal.  Pulmonary/Chest: Effort normal.  Neurological: She is alert and oriented to person, place, and time.  Skin: Skin is warm and dry.  Psychiatric: She has a normal mood and affect. Her behavior is normal.    Ortho Exam  Today's exam was quite difficult to ascertain exactly  where all her pain is.  She points to her radial styloid also in the wrist itself over the [facility] joint also.  She is diffusely tender over all those areas.  No warmth or erythema.  She does have decreased range of motion in dorsi and palmar flexion.  Pronation and supination are 70 degrees each.  Sensations intact light touch.  Good radial pulse.  Finkelstein's test is minimally positive.  Specialty Comments:  No specialty comments available.  Imaging: Xr Wrist Complete Left  Result Date: 11/04/2017 X-rays today reveals some mild subluxation of the first [facility] joint.  The lateral does show some possible some Duros dorsal spurring of the lunate.  Also some regional osteopenia in the distal radius.      PMFS History: Patient Active Problem List   Diagnosis Date Noted  . Anemia 09/11/2016  . Benign essential HTN 06/02/2016  . Heart murmur 06/02/2016   Past Medical History:  Diagnosis Date  . Hypertension     History reviewed. No pertinent family history.  Past Surgical History:  Procedure Laterality Date  . CARPAL TUNNEL RELEASE     Social History   Occupational History  . Not on file  Tobacco Use  . Smoking status: Never Smoker  . Smokeless tobacco: Never Used  Substance and Sexual Activity  . Alcohol use: No  . Drug use: No  . Sexual activity: Not Currently

## 2017-11-16 ENCOUNTER — Encounter (INDEPENDENT_AMBULATORY_CARE_PROVIDER_SITE_OTHER): Payer: Self-pay | Admitting: Orthopaedic Surgery

## 2017-11-16 ENCOUNTER — Ambulatory Visit (INDEPENDENT_AMBULATORY_CARE_PROVIDER_SITE_OTHER): Payer: BLUE CROSS/BLUE SHIELD | Admitting: Orthopaedic Surgery

## 2017-11-16 VITALS — BP 129/84 | HR 57 | Ht 60.0 in | Wt 107.0 lb

## 2017-11-16 DIAGNOSIS — M79642 Pain in left hand: Secondary | ICD-10-CM

## 2017-11-16 NOTE — Progress Notes (Signed)
Office Visit Note   Patient: Raven Cooper           Date of Birth: 1970/10/06           MRN: [id number] Visit Date: 11/16/2017              Requested by: No referring provider defined for this encounter. PCP: System, Pcp Not In   Assessment & Plan: Visit Diagnoses:  1. Pain of left hand     Plan: De Quervain's left hand and wrist.  Much better than when she was seen previously in the office.  Continue with the Voltaren gel on the splint.  Consider cortisone injection at some point in the future if there was an exacerbation of her pain  Follow-Up Instructions: Return if symptoms worsen or fail to improve.   Orders:  No orders of the defined types were placed in this encounter.  No orders of the defined types were placed in this encounter.     Procedures: No procedures performed   Clinical Data: No additional findings.   Subjective: Chief Complaint  Patient presents with  . Follow-up    L HAND PAIN WITH NUMBNESS STILL NO BETTER, USING BRACE AND VOLTAREN GEL  According to the patient she is definitely better than she was when she was in the office several weeks ago.  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  She feels like she has better motion of her hand and wrist no related numbness or tingling.  Accompanied by interpreter  HPI  Review of Systems  Constitutional: Negative for fatigue and fever.  HENT: Negative for ear pain.   Eyes: Negative for pain.  Respiratory: Negative for cough and shortness of breath.   Cardiovascular: Negative for leg swelling.  Gastrointestinal: Negative for constipation and diarrhea.  Genitourinary: Negative for difficulty urinating.  Musculoskeletal: Negative for back pain and neck pain.  Skin: Negative for rash.  Allergic/Immunologic: Negative for food allergies.  Neurological: Positive for numbness. Negative for weakness.  Hematological: Does not bruise/bleed easily.    Psychiatric/Behavioral: Negative for sleep disturbance.     Objective: Vital Signs: BP 129/84 (BP Location: Left Arm, Patient Position: Sitting, Cuff Size: Normal)   Pulse (!) 57   Ht 5' (1.524 m)   Wt 107 lb (48.5 kg)   BMI 20.90 kg/m   Physical Exam  Constitutional: She is oriented to person, place, and time. She appears well-developed and well-nourished.  HENT:  Mouth/Throat: Oropharynx is clear and moist.  Eyes: Pupils are equal, round, and reactive to light. EOM are normal.  Pulmonary/Chest: Effort normal.  Neurological: She is alert and oriented to person, place, and time.  Skin: Skin is warm and dry.  Psychiatric: She has a normal mood and affect. Her behavior is normal.    Ortho Exam awake alert and oriented x3.  Comfortable sitting.  Unable to converse in [place].  Interpreter accompanies patient.  Positive Finkelstein's test left wrist with mild tenderness and mild swelling over the first dorsal extensor compartment able to make a full fist and release.  Neurovascular exam intact.  No pain over the median nerve.  Patient subjectively relates she is much better than she was when she was seen in the office previously  Specialty Comments:  No specialty comments available.  Imaging: No results found.   PMFS History: Patient Active Problem List   Diagnosis Date Noted  . Anemia 09/11/2016  . Benign essential HTN 06/02/2016  .  Heart murmur 06/02/2016   Past Medical History:  Diagnosis Date  . Hypertension     History reviewed. No pertinent family history.  Past Surgical History:  Procedure Laterality Date  . CARPAL TUNNEL RELEASE     Social History   Occupational History  . Not on file  Tobacco Use  . Smoking status: Never Smoker  . Smokeless tobacco: Never Used  Substance and Sexual Activity  . Alcohol use: No  . Drug use: No  . Sexual activity: Not Currently

## 2017-12-07 ENCOUNTER — Other Ambulatory Visit: Payer: Self-pay | Admitting: Family Medicine

## 2017-12-08 NOTE — Telephone Encounter (Signed)
Patient is requesting Naproxen prescription refill, she was seen by Dr Cleophas Dunker at [facility]. He prescribed Diclofenac gel to apply on the wrist for pain. Please advise, thank you.

## 2017-12-08 NOTE — Telephone Encounter (Signed)
Naprosyn refill Last Refill: 10/27/17, # 60; RF 0 Last OV: 10/27/17 PCP: Leretha Pol Pharmacy: [facility] on [address]   Not refilled as pt. is followed by Orthopedics for left wrist pain; was prescribed Diclofenac Sodium, topically, on 8/28.  Please review/ advise

## 2018-02-15 ENCOUNTER — Other Ambulatory Visit: Payer: Self-pay | Admitting: Family Medicine

## 2018-02-17 NOTE — Telephone Encounter (Signed)
Requested medication (s) are due for refill today: Yes  Requested medication (s) are on the active medication list: Yes  Last refill:  11/04/17  Future visit scheduled: No  Notes to clinic:  Historical provider.    Requested Prescriptions  Pending Prescriptions Disp Refills   lisinopril-hydrochlorothiazide (PRINZIDE,ZESTORETIC) 20-12.5 MG tablet [Pharmacy Med Name: LISINOPRIL-HCTZ 20/12.5MG  TABLETS] 90 tablet 0    Sig: TAKE 1 TABLET BY MOUTH EVERY DAY     Cardiovascular:  ACEI + Diuretic Combos Failed - 02/15/2018  3:49 PM      Failed - Na in normal range and within 180 days    Sodium  Date Value Ref Range Status  06/02/2016 138 134 - 144 mmol/L Final         Failed - K in normal range and within 180 days    Potassium  Date Value Ref Range Status  06/02/2016 3.9 3.5 - 5.2 mmol/L Final         Failed - Cr in normal range and within 180 days    Creatinine, Ser  Date Value Ref Range Status  06/02/2016 0.61 0.57 - 1.00 mg/dL Final         Failed - Ca in normal range and within 180 days    Calcium  Date Value Ref Range Status  06/02/2016 8.4 (L) 8.7 - 10.2 mg/dL Final         Failed - Valid encounter within last 6 months    Recent Outpatient Visits          3 months ago De Quervain's disease (tenosynovitis)   [facility], [place]a Coffee[place]ma M, [place]   7 months ago Environmental allergies   [facility], [place], [place]   1 year ago Cough   [facility], [place], [place]   1 year ago Encounter for general adult medical examination with abnormal findings   [facility], [place], [place]             Passed - Patient is not pregnant      Passed - Last BP in normal range    BP Readings from Last 1 Encounters:  11/16/17 129/84

## 2018-02-22 DIAGNOSIS — I341 Nonrheumatic mitral (valve) prolapse: Secondary | ICD-10-CM | POA: Diagnosis not present

## 2018-03-22 DIAGNOSIS — I10 Essential (primary) hypertension: Secondary | ICD-10-CM | POA: Diagnosis not present

## 2018-03-22 DIAGNOSIS — Z0189 Encounter for other specified special examinations: Secondary | ICD-10-CM | POA: Diagnosis not present

## 2018-03-22 DIAGNOSIS — I341 Nonrheumatic mitral (valve) prolapse: Secondary | ICD-10-CM | POA: Diagnosis not present

## 2018-03-29 DIAGNOSIS — I10 Essential (primary) hypertension: Secondary | ICD-10-CM | POA: Diagnosis not present

## 2018-06-21 ENCOUNTER — Other Ambulatory Visit: Payer: Self-pay

## 2018-06-21 ENCOUNTER — Ambulatory Visit: Payer: BLUE CROSS/BLUE SHIELD | Admitting: Cardiology

## 2018-06-21 ENCOUNTER — Encounter: Payer: Self-pay | Admitting: Cardiology

## 2018-06-21 VITALS — BP 108/53 | HR 88 | Wt 121.5 lb

## 2018-06-21 DIAGNOSIS — I10 Essential (primary) hypertension: Secondary | ICD-10-CM | POA: Diagnosis not present

## 2018-06-21 MED ORDER — AMLODIPINE BESYLATE 10 MG PO TABS: ORAL_TABLET | ORAL | 3 refills | Status: DC

## 2018-06-21 MED ORDER — LISINOPRIL-HYDROCHLOROTHIAZIDE 20-12.5 MG PO TABS: 1.0000 | ORAL_TABLET | Freq: Every day | ORAL | 3 refills | Status: DC

## 2018-06-21 NOTE — Progress Notes (Signed)
Follow up visit  Subjective:   Raven Cooper, female    DOB: Feb 05, 1971, 48 y.o.   MRN: [id number]   Chief Complaint  Patient presents with  . Hypertension  . Follow-up     HPI  48 year old [place] female with hypertension, grade 2 diastolic dysfunction.  At last visit in 03/2018, patient had been out of her antihypertensive medications. I refilled her lisinopril-HCTZ and amlodipine and encouraged her to establish care with PCP.  This is 3 month follow up visit. It was scheduled to be a virtual visit, in light of COVID outbreak, however patients arrived in person for today's visit. Interpretor was used for today's visit. Patient denies chest pain, shortness of breath, palpitations, leg edema, orthopnea, PND, TIA/syncope. She has been compliant with her medical therapy/ Blood pressure is much better controlled. Echocardiogram is reassuring.    Past Medical History:  Diagnosis Date  . Hypertension     Past Surgical History:  Procedure Laterality Date  . CARPAL TUNNEL RELEASE       Social History   Socioeconomic History  . Marital status: Married    Spouse name: Not on file  . Number of children: 0  . Years of education: Not on file  . Highest education level: Not on file  Occupational History  . Not on file  Social Needs  . Financial resource strain: Not on file  . Food insecurity:    Worry: Not on file    Inability: Not on file  . Transportation needs:    Medical: Not on file    Non-medical: Not on file  Tobacco Use  . Smoking status: Never Smoker  . Smokeless tobacco: Never Used  Substance and Sexual Activity  . Alcohol use: No  . Drug use: No  . Sexual activity: Not Currently  Lifestyle  . Physical activity:    Days per week: Not on file    Minutes per session: Not on file  . Stress: Not on file  Relationships  . Social connections:    Talks on phone: Not on file    Gets together: Not on file    Attends religious service: Not on file    Active  member of club or organization: Not on file    Attends meetings of clubs or organizations: Not on file    Relationship status: Not on file  . Intimate partner violence:    Fear of current or ex partner: Not on file    Emotionally abused: Not on file    Physically abused: Not on file    Forced sexual activity: Not on file  Other Topics Concern  . Not on file  Social History Narrative  . Not on file     No family history on file.   Current Outpatient Medications on File Prior to Visit  Medication Sig Dispense Refill  . acetaminophen (TYLENOL) 500 MG tablet Take 500 mg by mouth every 6 (six) hours as needed.    . cetirizine (ZYRTEC) 10 MG tablet Take 1 tablet (10 mg total) by mouth daily. 30 tablet 11  . diclofenac sodium (VOLTAREN) 1 % GEL Apply 2-4 g topically 4 (four) times daily. To her hand and wrist area where painful 1 Tube 0  . glucosamine-chondroitin 500-400 MG tablet Take 1 tablet by mouth daily.     . ferrous sulfate (FEOSOL) 325 (65 FE) MG tablet Take 1 tablet (325 mg total) by mouth daily with breakfast. 90 tablet 3  . naproxen (NAPROSYN) 500 MG  tablet Take 1 tablet (500 mg total) by mouth 2 (two) times daily with a meal. (Patient not taking: Reported on 06/21/2018) 60 tablet 0  . [DISCONTINUED] azelastine (ASTELIN) 0.1 % nasal spray Place 2 sprays into both nostrils 2 (two) times daily. Use in each nostril as directed (Patient not taking: Reported on 06/29/2017) 30 mL 0   No current facility-administered medications on file prior to visit.     Cardiovascular studies:   EKG 03/22/2018: Sinus rhythm 58 bpm. Normal EKG   Treadmill exercise stress test 07/21/2016: Indication: HTN and Chest Pressure Resting EKG demonstrates NSR. The patient exercised according to [facility] Protocol, Total time recorded  7:30 min achieving max heart rate of  160 which was  91% of THR for age and  7.34 METS of work.  Stress terminated due to THR (>85% MPHR)/MPHR met. Normal BP  response. There was no ST-T changes of ischemia with exercise stress test. There were no significant arrhythmias.  Normal BP response.  Rec: No e/o ischemia by GXT. Exercise tolerence is  low normal .  Continue Preventive therapy.  Echocardiogram 02/22/2018: Left ventricle cavity is normal in size. Normal global wall motion. Doppler evidence of grade II (pseudonormal) diastolic dysfunction, elevated LAP. Calculated EF 61%. Left atrial cavity is mildly dilated. Mild (Grade I) aortic regurgitation. Trace mitral regurgitation.  Trace tricuspid regurgitation. Unable to estimate PA pressure due to absence/minimal TR signal. IVC is normal with blunted respiratory response. May suggest elevated central venous pressure. Compared to the study done on 06/17/2016, no significant change, previously mild MVP noted, this is not obvious in the present study.  Also previously normal diastolic parameters.  Recent labs: 03/29/2018: Glucose 107. BUN/Cr 14/0.63. eGFR normal. Na/K 142/4.4   ROS       Vitals:   06/21/18 1050  BP: (!) 108/53  Pulse: 88  SpO2: 98%    Objective:   Physical Exam  Constitutional: She is oriented to person, place, and time. She appears well-developed and well-nourished. No distress.  HENT:  Head: Normocephalic and atraumatic.  Eyes: Pupils are equal, round, and reactive to light. Conjunctivae are normal.  Neck: No JVD present.  Cardiovascular: Normal rate, regular rhythm and intact distal pulses.  Pulmonary/Chest: Effort normal and breath sounds normal. She has no wheezes. She has no rales.  Abdominal: Soft. Bowel sounds are normal. There is no rebound.  Musculoskeletal:        General: No edema.  Lymphadenopathy:    She has no cervical adenopathy.  Neurological: She is alert and oriented to person, place, and time. No cranial nerve deficit.  Skin: Skin is warm and dry.  Psychiatric: She has a normal mood and affect.  Nursing note and vitals reviewed.          Assessment & Recommendations:    48 year old [place] female with hypertension, grade 2 diastolic dysfunction.  Hypertension: Well controlled. Refilled lisinopril-HCTZ and amlodipine. CHeck BMP today.  I have encouraged her to get a PCP, have referred her to Dr. Donald Prose.  Follow up with me in 6 months.    Nigel Mormon, MD [facility]. [place] Pager: [phone] Office: [phone] If no answer Cell [phone]

## 2018-08-21 IMAGING — DX DG CHEST 2V
2 series · 2 of 2 positions shown · non-contrast
Comparison: None.

CLINICAL DATA: Productive cough for 2 weeks.

EXAM:
CHEST  2 VIEW

[chest pa]
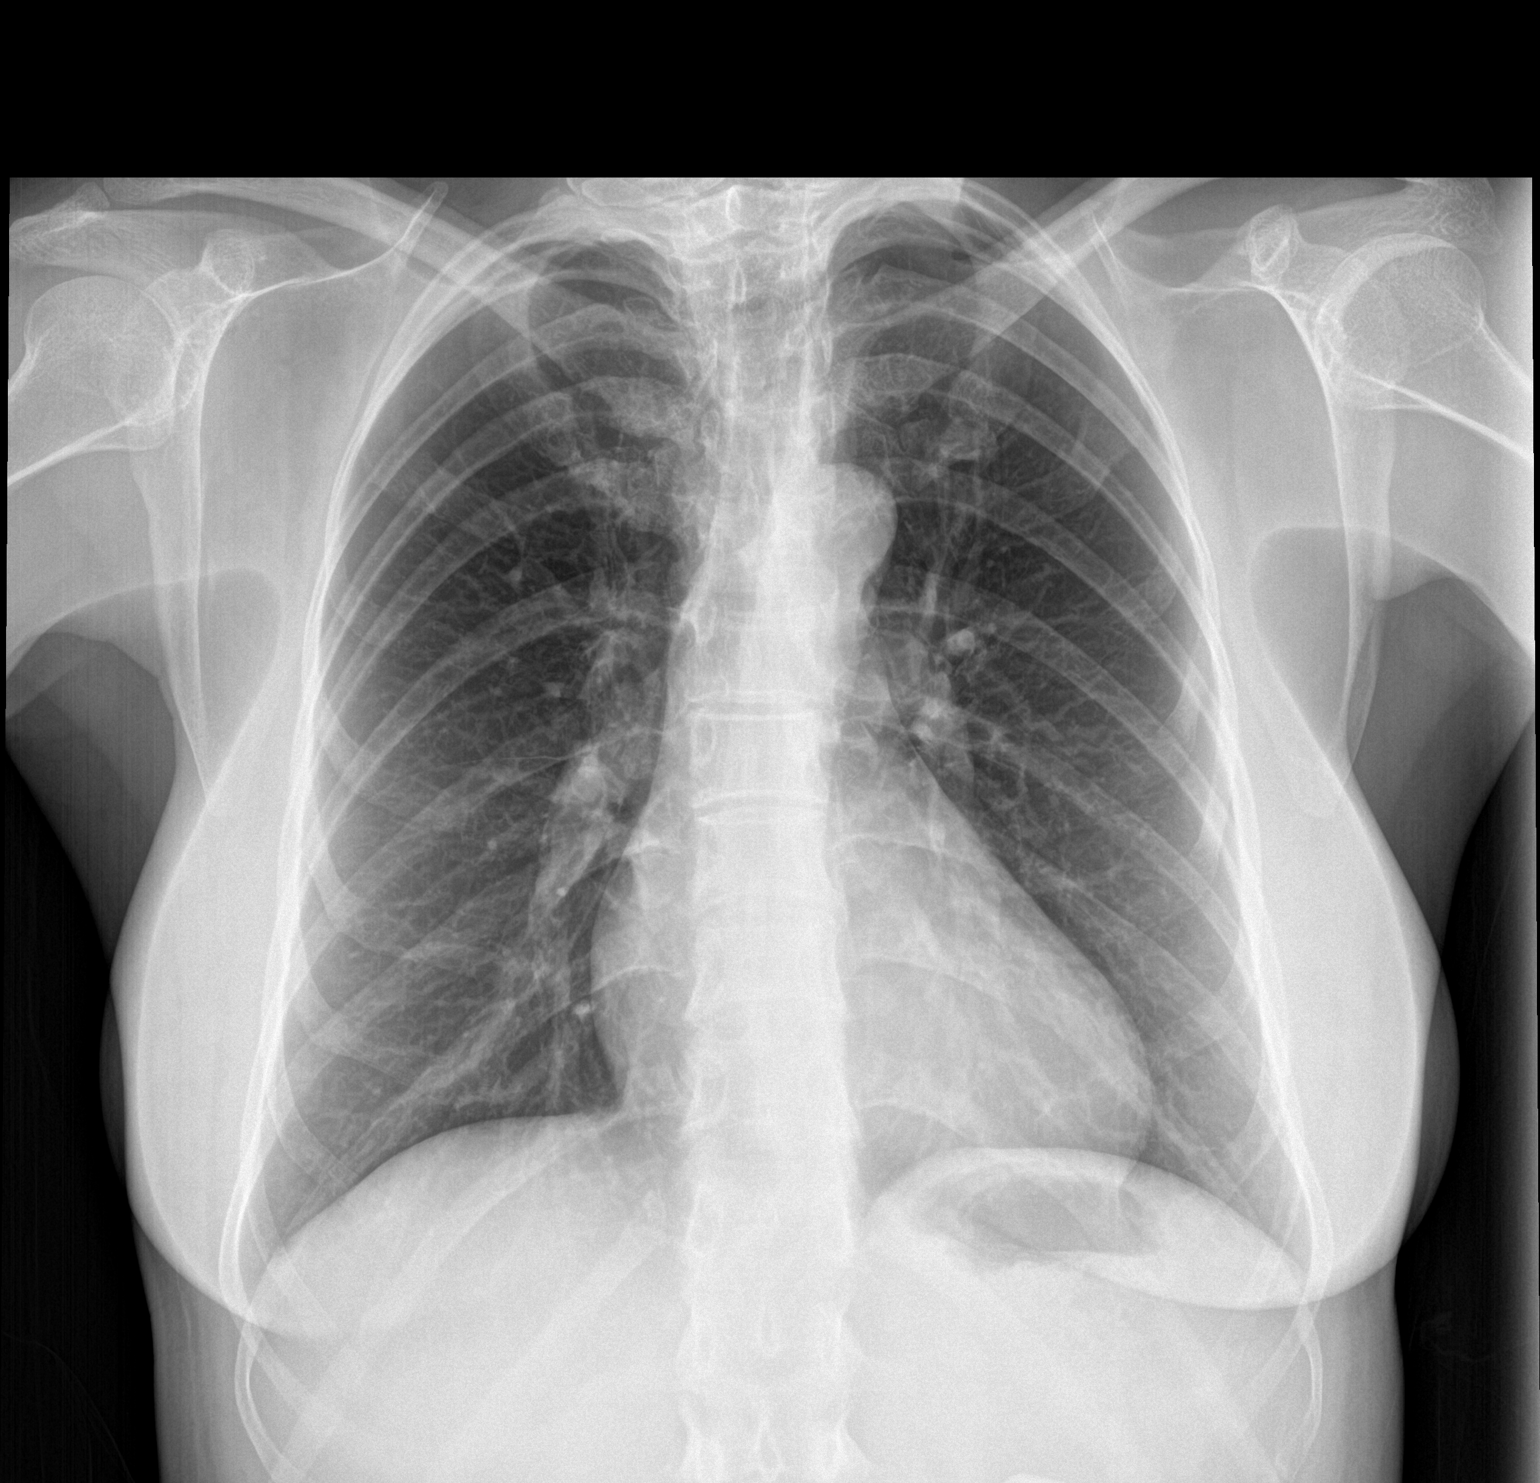

[chest lat]
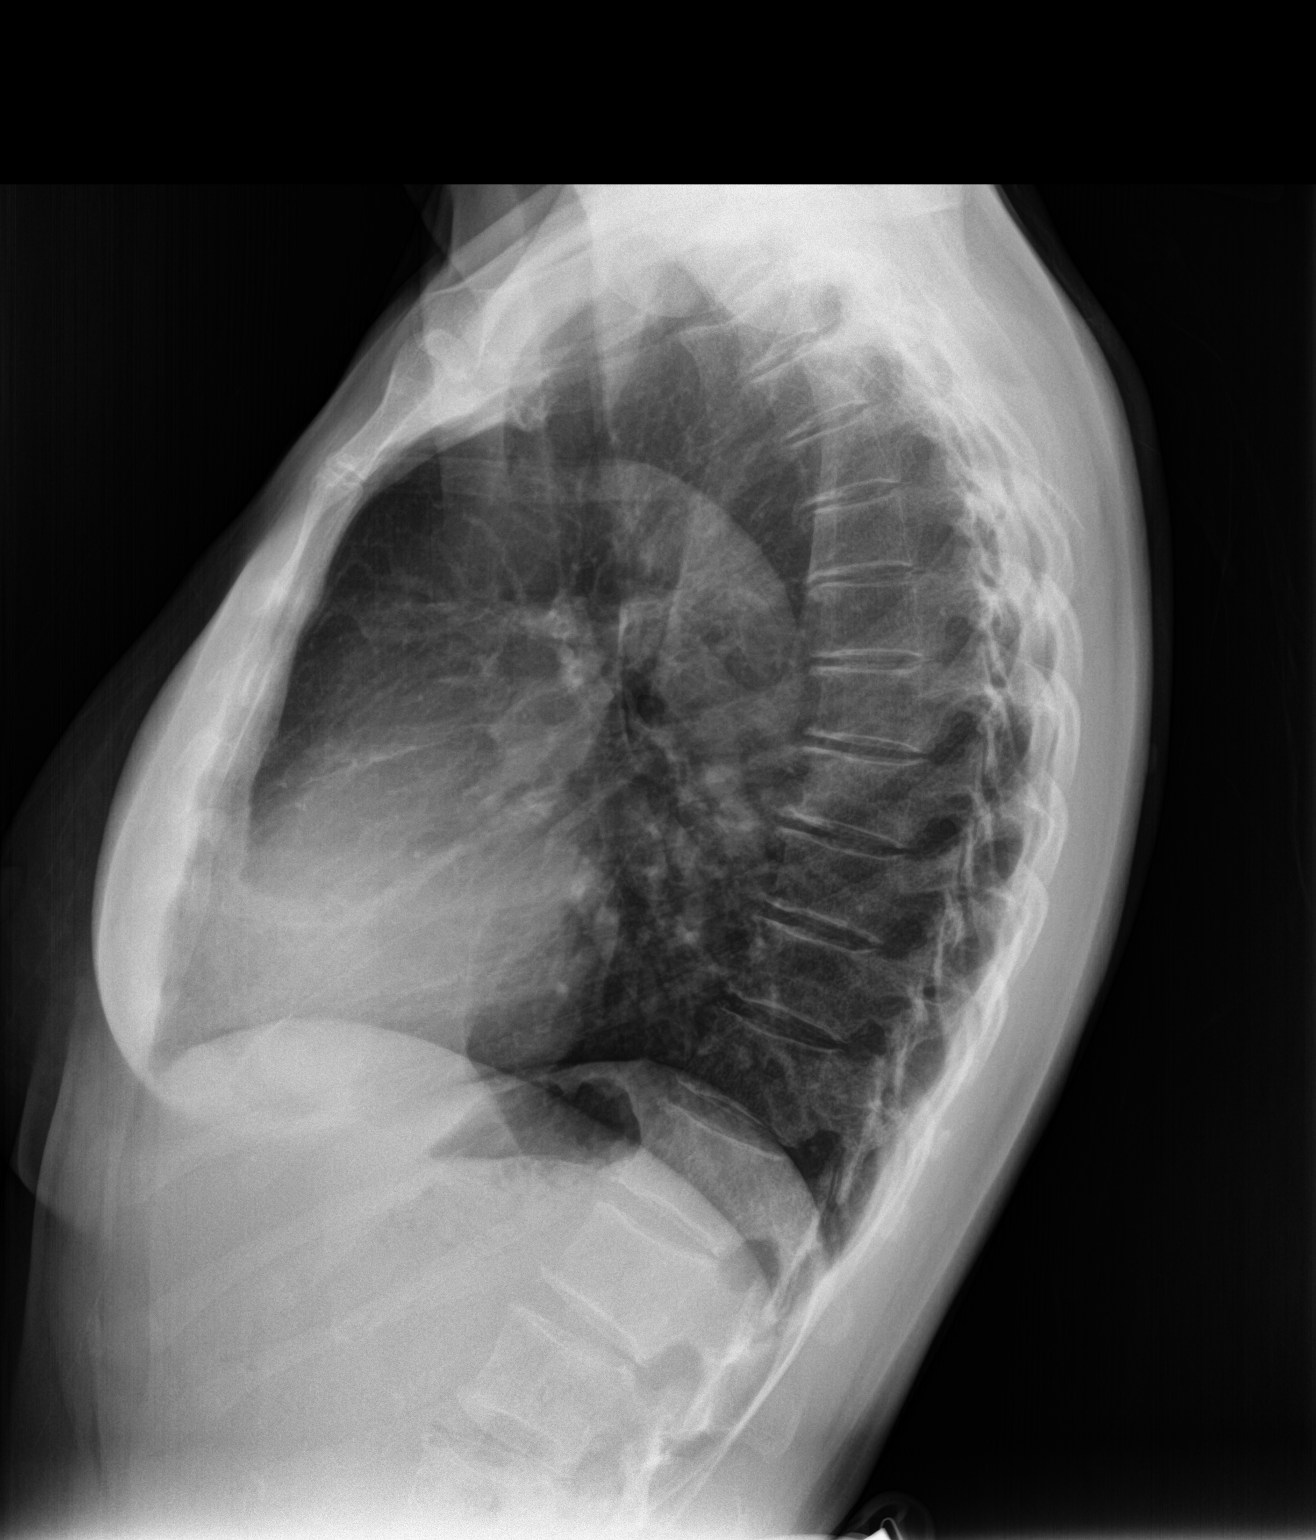

[2 of 2 positions shown; findings below may reference images not displayed]

FINDINGS: The cardiomediastinal silhouette is within normal limits. The lungs
are well inflated and clear. There is no evidence of pleural
effusion or pneumothorax. No acute osseous abnormality is
identified.
IMPRESSION: No active cardiopulmonary disease.

## 2018-12-22 ENCOUNTER — Ambulatory Visit: Payer: BLUE CROSS/BLUE SHIELD | Admitting: Cardiology

## 2019-03-09 DIAGNOSIS — Z03818 Encounter for observation for suspected exposure to other biological agents ruled out: Secondary | ICD-10-CM | POA: Diagnosis not present

## 2019-03-09 DIAGNOSIS — Z20828 Contact with and (suspected) exposure to other viral communicable diseases: Secondary | ICD-10-CM | POA: Diagnosis not present

## 2019-06-13 ENCOUNTER — Other Ambulatory Visit: Payer: Self-pay | Admitting: Cardiology

## 2019-06-13 DIAGNOSIS — I10 Essential (primary) hypertension: Secondary | ICD-10-CM

## 2019-07-10 ENCOUNTER — Other Ambulatory Visit: Payer: Self-pay | Admitting: Cardiology

## 2019-07-10 DIAGNOSIS — I10 Essential (primary) hypertension: Secondary | ICD-10-CM

## 2019-10-06 IMAGING — DX DG WRIST 2V*L*
2 series · 2 of 2 positions shown · non-contrast
Comparison: None.

CLINICAL DATA: Swelling, redness left wrist

EXAM:
LEFT WRIST - 2 VIEW

[wrist pa]
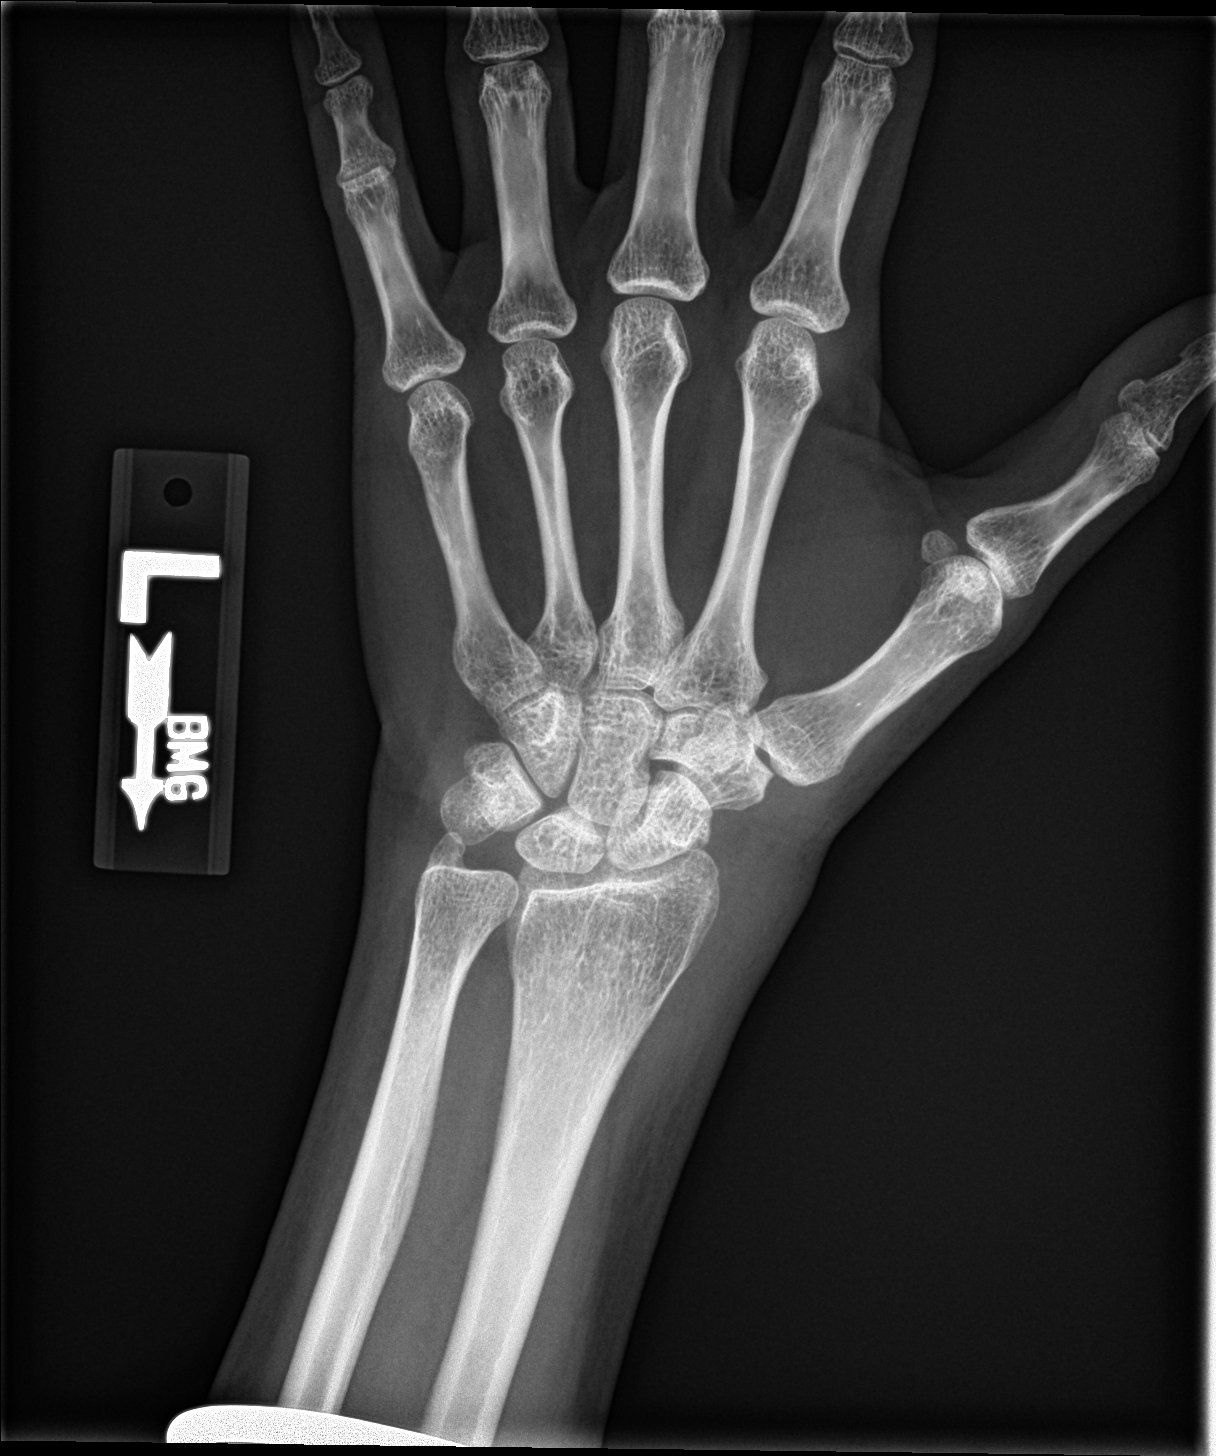

[wrist lat]
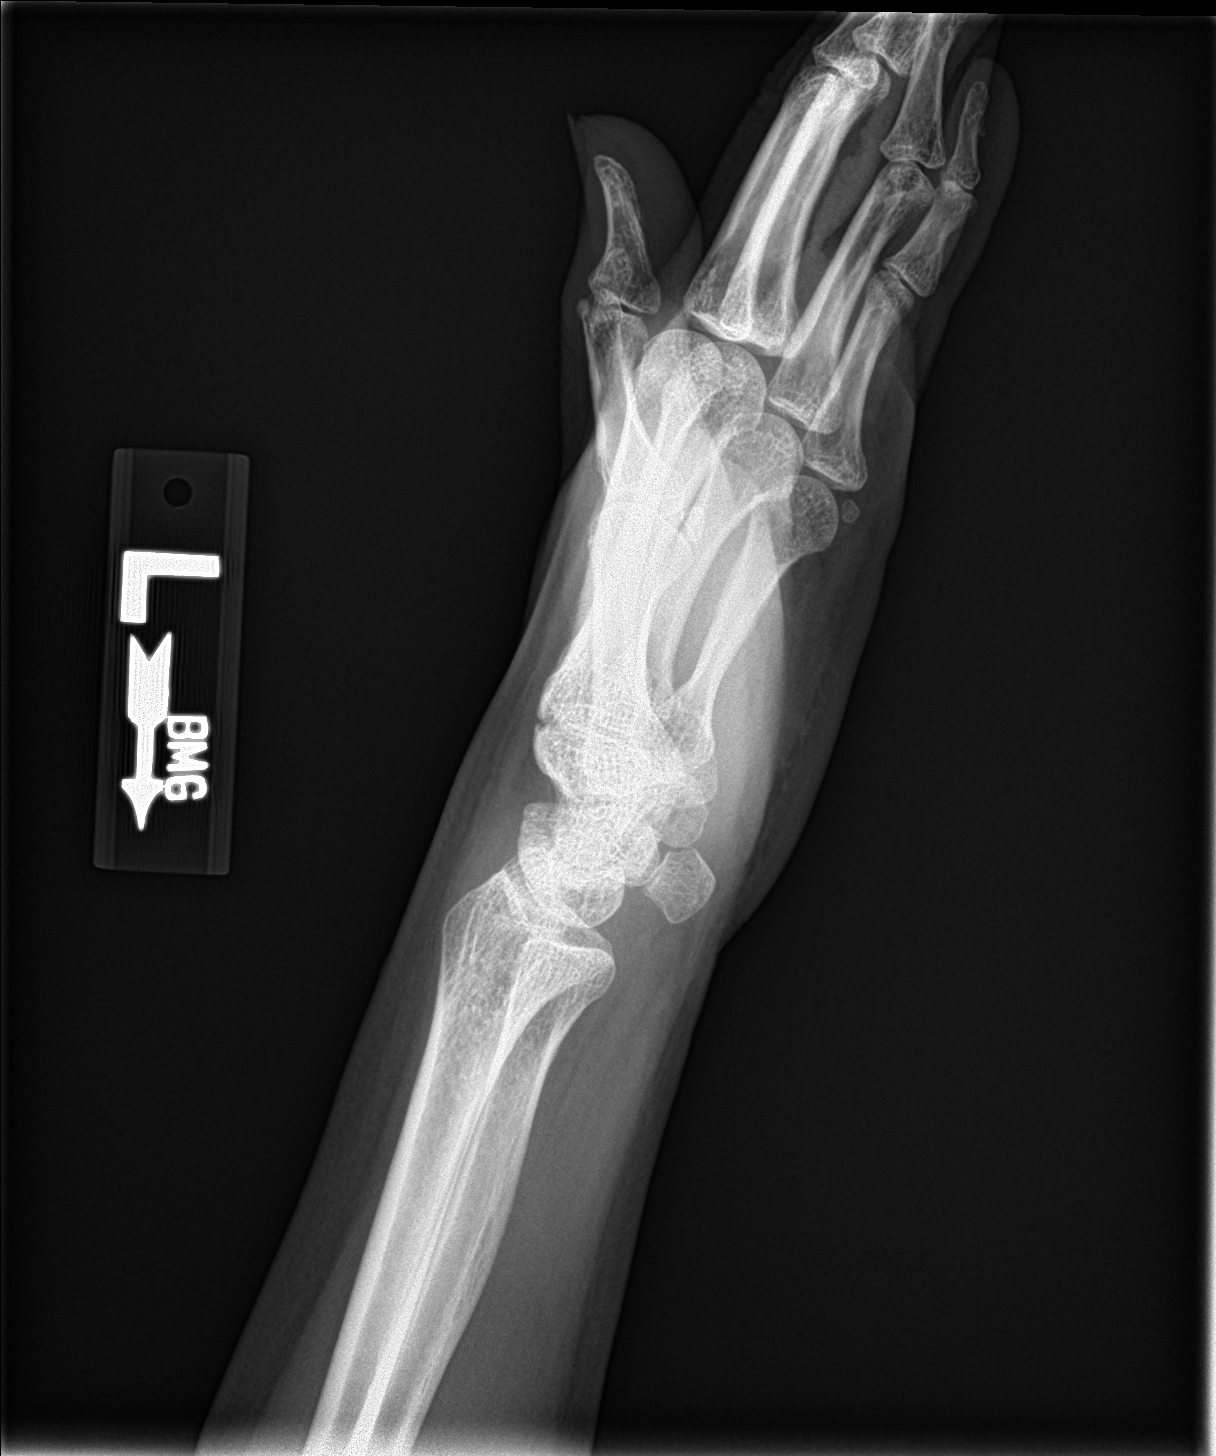

[2 of 2 positions shown; findings below may reference images not displayed]

FINDINGS: There is no evidence of fracture or dislocation. There is no
evidence of arthropathy or other focal bone abnormality. Soft
tissues are unremarkable.
IMPRESSION: Negative.

## 2020-01-03 ENCOUNTER — Other Ambulatory Visit: Payer: Self-pay | Admitting: Cardiology

## 2020-01-03 DIAGNOSIS — I10 Essential (primary) hypertension: Secondary | ICD-10-CM

## 2020-01-04 ENCOUNTER — Other Ambulatory Visit: Payer: Self-pay

## 2020-01-04 ENCOUNTER — Ambulatory Visit: Payer: BLUE CROSS/BLUE SHIELD | Admitting: Cardiology

## 2020-01-04 ENCOUNTER — Encounter: Payer: Self-pay | Admitting: Cardiology

## 2020-01-04 VITALS — BP 108/59 | HR 67 | Resp 16 | Ht 60.0 in | Wt 112.0 lb

## 2020-01-04 DIAGNOSIS — I10 Essential (primary) hypertension: Secondary | ICD-10-CM | POA: Diagnosis not present

## 2020-01-04 MED ORDER — LISINOPRIL-HYDROCHLOROTHIAZIDE 20-12.5 MG PO TABS: 1.0000 | ORAL_TABLET | Freq: Every day | ORAL | 3 refills | Status: DC

## 2020-01-04 NOTE — Progress Notes (Signed)
Follow up visit  Subjective:   Raven Cooper, female    DOB: [birth year], 49 y.o.   MRN: [id number]   Chief Complaint  Patient presents with  . Hypertension  . Follow-up     HPI  49 year old [place] female with hypertension, grade 2 diastolic dysfunction.  Interpreter used for today' visit.  Patient is doing well, denies chest pain, shortness of breath, palpitations, leg edema, orthopnea, PND, TIA/syncope. Blood pressure is well controlled. She doe snot have a PCP.      Current Outpatient Medications on File Prior to Visit  Medication Sig Dispense Refill  . acetaminophen (TYLENOL) 500 MG tablet Take 500 mg by mouth every 6 (six) hours as needed.    Marland Kitchen amLODipine (NORVASC) 10 MG tablet TAKE 1 TABLET BY MOUTH DAILY 90 tablet 1  . cetirizine (ZYRTEC) 10 MG tablet Take 1 tablet (10 mg total) by mouth daily. 30 tablet 11  . diclofenac sodium (VOLTAREN) 1 % GEL Apply 2-4 g topically 4 (four) times daily. To her hand and wrist area where painful 1 Tube 0  . ferrous sulfate (FEOSOL) 325 (65 FE) MG tablet Take 1 tablet (325 mg total) by mouth daily with breakfast. 90 tablet 3  . glucosamine-chondroitin 500-400 MG tablet Take 1 tablet by mouth daily.     Marland Kitchen lisinopril-hydrochlorothiazide (ZESTORETIC) 20-12.5 MG tablet TAKE 1 TABLET BY MOUTH DAILY 90 tablet 1  . naproxen (NAPROSYN) 500 MG tablet Take 1 tablet (500 mg total) by mouth 2 (two) times daily with a meal. (Patient not taking: Reported on 06/21/2018) 60 tablet 0  . [DISCONTINUED] azelastine (ASTELIN) 0.1 % nasal spray Place 2 sprays into both nostrils 2 (two) times daily. Use in each nostril as directed (Patient not taking: Reported on 06/29/2017) 30 mL 0   No current facility-administered medications on file prior to visit.    Cardiovascular studies:  EKG 01/04/2020: Sinus rhythm 62 bpm  Nonspecific T-abnormality   Treadmill exercise stress test 07/21/2016: Indication: HTN and Chest Pressure Resting EKG demonstrates  NSR. The patient exercised according to [facility] Protocol, Total time recorded  7:30 min achieving max heart rate of  160 which was  91% of THR for age and  7.34 METS of work.  Stress terminated due to THR (>85% MPHR)/MPHR met. Normal BP response. There was no ST-T changes of ischemia with exercise stress test. There were no significant arrhythmias.  Normal BP response.  Rec: No e/o ischemia by GXT. Exercise tolerence is  low normal .  Continue Preventive therapy.  Echocardiogram 02/22/2018: Left ventricle cavity is normal in size. Normal global wall motion. Doppler evidence of grade II (pseudonormal) diastolic dysfunction, elevated LAP. Calculated EF 61%. Left atrial cavity is mildly dilated. Mild (Grade I) aortic regurgitation. Trace mitral regurgitation.  Trace tricuspid regurgitation. Unable to estimate PA pressure due to absence/minimal TR signal. IVC is normal with blunted respiratory response. May suggest elevated central venous pressure. Compared to the study done on 06/17/2016, no significant change, previously mild MVP noted, this is not obvious in the present study.  Also previously normal diastolic parameters.  Recent labs: 03/29/2018: Glucose 107. BUN/Cr 14/0.63. eGFR normal. Na/K 142/4.4   Review of Systems  Cardiovascular: Negative for chest pain, dyspnea on exertion, leg swelling, palpitations and syncope.         Vitals:   01/04/20 1121  BP: (!) 108/59  Pulse: 67  Resp: 16  SpO2: 96%    Objective:   Physical Exam Vitals and nursing note reviewed.  Constitutional:      General: She is not in acute distress. Neck:     Vascular: No JVD.  Cardiovascular:     Rate and Rhythm: Normal rate and regular rhythm.     Heart sounds: Normal heart sounds. No murmur heard.   Pulmonary:     Effort: Pulmonary effort is normal.     Breath sounds: Normal breath sounds. No wheezing or rales.           Assessment & Recommendations:    49 year old  [place] female with hypertension, grade 2 diastolic dysfunction.  Hypertension: Well controlled. Refilled lisinopril-HCTZ and amlodipine.  Check labs today.  I have encouraged her to get a PCP  F/u in 1 year.   Nigel Mormon, MD [facility] [place] Pager: [phone] Office: [phone] If no answer Cell [phone]

## 2020-01-11 LAB — BASIC METABOLIC PANEL
BUN/Creatinine Ratio: 25 — ABNORMAL HIGH (ref 9–23)
BUN: 14 mg/dL (ref 6–24)
CO2: 25 mmol/L (ref 20–29)
Calcium: 9.8 mg/dL (ref 8.7–10.2)
Chloride: 101 mmol/L (ref 96–106)
Creatinine, Ser: 0.55 mg/dL — ABNORMAL LOW (ref 0.57–1.00)
GFR calc Af Amer: 126 mL/min/{1.73_m2} (ref >59)
GFR calc non Af Amer: 110 mL/min/{1.73_m2} (ref >59)
Glucose: 89 mg/dL (ref 65–99)
Potassium: 4.4 mmol/L (ref 3.5–5.2)
Sodium: 139 mmol/L (ref 134–144)

## 2020-01-11 LAB — LIPID PANEL
Chol/HDL Ratio: 2.9 ratio (ref 0.0–4.4)
Cholesterol, Total: 164 mg/dL (ref 100–199)
HDL: 57 mg/dL (ref >39)
LDL Chol Calc (NIH): 85 mg/dL (ref 0–99)
Triglycerides: 124 mg/dL (ref 0–149)
VLDL Cholesterol Cal: 22 mg/dL (ref 5–40)

## 2020-07-12 ENCOUNTER — Other Ambulatory Visit: Payer: Self-pay | Admitting: Cardiology

## 2020-07-12 DIAGNOSIS — I10 Essential (primary) hypertension: Secondary | ICD-10-CM

## 2021-01-03 ENCOUNTER — Ambulatory Visit: Payer: BLUE CROSS/BLUE SHIELD | Admitting: Cardiology

## 2021-01-03 ENCOUNTER — Other Ambulatory Visit: Payer: Self-pay

## 2021-01-03 ENCOUNTER — Encounter: Payer: Self-pay | Admitting: Cardiology

## 2021-01-03 VITALS — BP 108/69 | HR 75 | Temp 98.0°F | Resp 16 | Ht 60.0 in | Wt 112.0 lb

## 2021-01-03 DIAGNOSIS — I10 Essential (primary) hypertension: Secondary | ICD-10-CM

## 2021-01-03 MED ORDER — AMLODIPINE BESYLATE 10 MG PO TABS: 10.0000 mg | ORAL_TABLET | Freq: Every day | ORAL | 1 refills | Status: AC

## 2021-01-03 MED ORDER — LISINOPRIL-HYDROCHLOROTHIAZIDE 20-12.5 MG PO TABS: 1.0000 | ORAL_TABLET | Freq: Every day | ORAL | 1 refills | Status: DC

## 2021-01-03 NOTE — Progress Notes (Signed)
Follow up visit  Subjective:   Raven Cooper, female    DOB: 02-11-1971, 50 y.o.   MRN: [id number]   Chief Complaint  Patient presents with   Hypertension   Follow-up    1 year     HPI  50 year old [place] female with hypertension, grade 2 diastolic dysfunction.  Interpreter used for today' visit.   Patient is doing well, denies chest pain, shortness of breath, palpitations, leg edema, orthopnea, PND, TIA/syncope. Blood pressure is well controlled. She now has a PCP-Dr. Zenda Alpers.  She reportedly underwent test yesterday, results not available to me.    Current Outpatient Medications on File Prior to Visit  Medication Sig Dispense Refill   acetaminophen (TYLENOL) 500 MG tablet Take 500 mg by mouth every 6 (six) hours as needed.     amLODipine (NORVASC) 10 MG tablet TAKE 1 TABLET BY MOUTH DAILY 90 tablet 1   cetirizine (ZYRTEC) 10 MG tablet Take 1 tablet (10 mg total) by mouth daily. 30 tablet 11   diclofenac sodium (VOLTAREN) 1 % GEL Apply 2-4 g topically 4 (four) times daily. To her hand and wrist area where painful 1 Tube 0   glucosamine-chondroitin 500-400 MG tablet Take 1 tablet by mouth daily.      lisinopril-hydrochlorothiazide (ZESTORETIC) 20-12.5 MG tablet Take 1 tablet by mouth daily. 90 tablet 3   [DISCONTINUED] azelastine (ASTELIN) 0.1 % nasal spray Place 2 sprays into both nostrils 2 (two) times daily. Use in each nostril as directed (Patient not taking: Reported on 06/29/2017) 30 mL 0   No current facility-administered medications on file prior to visit.    Cardiovascular studies:  EKG 01/03/2021: Sinus rhythm 74 bpm Low voltage in precordial leads  Nonspecific T-abnormality  Treadmill exercise stress test 07/21/2016: Indication: HTN and Chest Pressure Resting EKG demonstrates NSR. The patient exercised according to [facility] Protocol, Total time recorded  7:30 min achieving max heart rate of  160 which was  91% of THR for age and  7.34 METS of  work.  Stress terminated due to THR (>85% MPHR)/MPHR met. Normal BP response. There was no ST-T changes of ischemia with exercise stress test. There were no significant arrhythmias.  Normal BP response.  Rec: No e/o ischemia by GXT. Exercise tolerence is  low normal .  Continue Preventive therapy.  Echocardiogram 02/22/2018: Left ventricle cavity is normal in size. Normal global wall motion. Doppler evidence of grade II (pseudonormal) diastolic dysfunction, elevated LAP. Calculated EF 61%. Left atrial cavity is mildly dilated. Mild (Grade I) aortic regurgitation. Trace mitral regurgitation.  Trace tricuspid regurgitation. Unable to estimate PA pressure due to absence/minimal TR signal. IVC is normal with blunted respiratory response. May suggest elevated central venous pressure. Compared to the study done on 06/17/2016, no significant change, previously mild MVP noted, this is not obvious in the present study.  Also previously normal diastolic parameters.  Recent labs: 01/09/2020: Glucose 89, BUN/Cr 14/0.55. EGFR 110. Na/K 139/4.4. Rest of the CMP normal Chol 164, TG 124, HDL 57, LDL 85   Review of Systems  Cardiovascular:  Negative for chest pain, dyspnea on exertion, leg swelling, palpitations and syncope.        Vitals:   01/03/21 1000  BP: 108/69  Pulse: 75  Resp: 16  Temp: 98 F (36.7 C)  SpO2: 99%    Objective:   Physical Exam Vitals and nursing note reviewed.  Constitutional:      General: She is not in acute distress. Neck:  Vascular: No JVD.  Cardiovascular:     Rate and Rhythm: Normal rate and regular rhythm.     Heart sounds: Normal heart sounds. No murmur heard. Pulmonary:     Effort: Pulmonary effort is normal.     Breath sounds: Normal breath sounds. No wheezing or rales.  Musculoskeletal:     Right lower leg: No edema.     Left lower leg: No edema.          Assessment & Recommendations:    50 year old [place] female with hypertension,  grade 2 diastolic dysfunction.  Hypertension: Well controlled. Refilled lisinopril-HCTZ and amlodipine.  Future refills can be prescribed by her PCP Dr. Dahlia Client.  F/u as needed  Nigel Mormon, MD [facility] [place] Pager: [phone] Office: [phone] If no answer Cell [phone]

## 2021-01-08 ENCOUNTER — Other Ambulatory Visit: Payer: Self-pay | Admitting: Cardiology

## 2021-01-08 DIAGNOSIS — I10 Essential (primary) hypertension: Secondary | ICD-10-CM

## 2021-09-30 ENCOUNTER — Other Ambulatory Visit: Payer: Self-pay | Admitting: Cardiology

## 2021-09-30 DIAGNOSIS — I10 Essential (primary) hypertension: Secondary | ICD-10-CM
